# Patient Record
Sex: Female | Born: 1955 | Race: Black or African American | Hispanic: No | Marital: Single | Smoking: Former smoker
Health system: Southern US, Community
[De-identification: ages and names within clinical notes are randomized; demographics above are authoritative.]

## PROBLEM LIST (undated history)

## (undated) DIAGNOSIS — I10 Essential (primary) hypertension: Secondary | ICD-10-CM

## (undated) DIAGNOSIS — D860 Sarcoidosis of lung: Secondary | ICD-10-CM

## (undated) DIAGNOSIS — R0600 Dyspnea, unspecified: Secondary | ICD-10-CM

## (undated) HISTORY — PX: LUNG BIOPSY: SHX232

## (undated) HISTORY — DX: Dyspnea, unspecified: R06.00

---

## 1997-07-19 ENCOUNTER — Other Ambulatory Visit: Admission: RE | Admit: 1997-07-19 | Discharge: 1997-07-19 | Payer: Self-pay | Admitting: Obstetrics

## 1999-02-19 ENCOUNTER — Emergency Department (HOSPITAL_COMMUNITY): Admission: EM | Admit: 1999-02-19 | Discharge: 1999-02-19 | Payer: Self-pay | Admitting: Emergency Medicine

## 1999-05-04 ENCOUNTER — Emergency Department (HOSPITAL_COMMUNITY): Admission: EM | Admit: 1999-05-04 | Discharge: 1999-05-04 | Payer: Self-pay | Admitting: Emergency Medicine

## 1999-11-20 ENCOUNTER — Encounter: Admission: RE | Admit: 1999-11-20 | Discharge: 1999-11-20 | Payer: Self-pay | Admitting: Obstetrics

## 1999-11-20 ENCOUNTER — Encounter: Payer: Self-pay | Admitting: Obstetrics

## 2000-11-20 ENCOUNTER — Encounter: Admission: RE | Admit: 2000-11-20 | Discharge: 2000-11-20 | Payer: Self-pay | Admitting: Obstetrics

## 2000-11-20 ENCOUNTER — Encounter: Payer: Self-pay | Admitting: Obstetrics

## 2001-11-25 ENCOUNTER — Encounter: Admission: RE | Admit: 2001-11-25 | Discharge: 2001-11-25 | Payer: Self-pay | Admitting: Obstetrics

## 2001-11-25 ENCOUNTER — Encounter: Payer: Self-pay | Admitting: Obstetrics

## 2002-11-30 ENCOUNTER — Encounter: Payer: Self-pay | Admitting: Obstetrics

## 2002-11-30 ENCOUNTER — Encounter: Admission: RE | Admit: 2002-11-30 | Discharge: 2002-11-30 | Payer: Self-pay | Admitting: Obstetrics

## 2004-01-05 ENCOUNTER — Encounter: Admission: RE | Admit: 2004-01-05 | Discharge: 2004-01-05 | Payer: Self-pay | Admitting: Obstetrics

## 2005-02-28 ENCOUNTER — Encounter: Admission: RE | Admit: 2005-02-28 | Discharge: 2005-02-28 | Payer: Self-pay | Admitting: Obstetrics

## 2005-03-11 ENCOUNTER — Ambulatory Visit: Payer: Self-pay | Admitting: Internal Medicine

## 2005-04-16 ENCOUNTER — Ambulatory Visit: Payer: Self-pay | Admitting: Internal Medicine

## 2005-05-03 ENCOUNTER — Emergency Department (HOSPITAL_COMMUNITY): Admission: EM | Admit: 2005-05-03 | Discharge: 2005-05-03 | Payer: Self-pay | Admitting: Emergency Medicine

## 2006-08-11 ENCOUNTER — Encounter: Admission: RE | Admit: 2006-08-11 | Discharge: 2006-08-11 | Payer: Self-pay | Admitting: Obstetrics

## 2007-08-12 ENCOUNTER — Encounter: Admission: RE | Admit: 2007-08-12 | Discharge: 2007-08-12 | Payer: Self-pay | Admitting: Obstetrics

## 2008-08-30 ENCOUNTER — Encounter: Admission: RE | Admit: 2008-08-30 | Discharge: 2008-08-30 | Payer: Self-pay | Admitting: Obstetrics

## 2008-09-07 ENCOUNTER — Encounter: Admission: RE | Admit: 2008-09-07 | Discharge: 2008-09-07 | Payer: Self-pay | Admitting: Obstetrics

## 2009-10-19 ENCOUNTER — Encounter: Admission: RE | Admit: 2009-10-19 | Discharge: 2009-10-19 | Payer: Self-pay | Admitting: Obstetrics

## 2010-05-13 ENCOUNTER — Encounter: Payer: Self-pay | Admitting: Obstetrics

## 2010-10-24 ENCOUNTER — Other Ambulatory Visit: Payer: Self-pay | Admitting: Obstetrics

## 2010-10-24 DIAGNOSIS — Z1231 Encounter for screening mammogram for malignant neoplasm of breast: Secondary | ICD-10-CM

## 2010-11-08 ENCOUNTER — Ambulatory Visit: Payer: Self-pay

## 2010-11-08 ENCOUNTER — Ambulatory Visit
Admission: RE | Admit: 2010-11-08 | Discharge: 2010-11-08 | Disposition: A | Discharge status: Home / Self Care | Payer: BC Managed Care – PPO | Source: Ambulatory Visit | Attending: Obstetrics | Admitting: Obstetrics

## 2010-11-08 DIAGNOSIS — Z1231 Encounter for screening mammogram for malignant neoplasm of breast: Secondary | ICD-10-CM

## 2011-11-11 ENCOUNTER — Other Ambulatory Visit: Payer: Self-pay | Admitting: Obstetrics

## 2011-11-11 DIAGNOSIS — Z1231 Encounter for screening mammogram for malignant neoplasm of breast: Secondary | ICD-10-CM

## 2011-11-14 ENCOUNTER — Ambulatory Visit
Admission: RE | Admit: 2011-11-14 | Discharge: 2011-11-14 | Disposition: A | Discharge status: Home / Self Care | Payer: BC Managed Care – PPO | Source: Ambulatory Visit | Attending: Obstetrics | Admitting: Obstetrics

## 2011-11-14 DIAGNOSIS — Z1231 Encounter for screening mammogram for malignant neoplasm of breast: Secondary | ICD-10-CM

## 2012-04-15 ENCOUNTER — Encounter (HOSPITAL_COMMUNITY): Payer: Self-pay | Admitting: Emergency Medicine

## 2012-04-15 ENCOUNTER — Emergency Department (HOSPITAL_COMMUNITY)
Admission: EM | Admit: 2012-04-15 | Discharge: 2012-04-16 | Disposition: A | Discharge status: Home / Self Care | Payer: BC Managed Care – PPO | Attending: Emergency Medicine | Admitting: Emergency Medicine

## 2012-04-15 DIAGNOSIS — D869 Sarcoidosis, unspecified: Secondary | ICD-10-CM | POA: Insufficient documentation

## 2012-04-15 DIAGNOSIS — R109 Unspecified abdominal pain: Secondary | ICD-10-CM

## 2012-04-15 DIAGNOSIS — Z87891 Personal history of nicotine dependence: Secondary | ICD-10-CM | POA: Insufficient documentation

## 2012-04-15 DIAGNOSIS — R1031 Right lower quadrant pain: Secondary | ICD-10-CM | POA: Insufficient documentation

## 2012-04-15 HISTORY — DX: Sarcoidosis of lung: D86.0

## 2012-04-15 LAB — URINALYSIS, ROUTINE W REFLEX MICROSCOPIC
Glucose, UA: NEGATIVE mg/dL
Nitrite: NEGATIVE
Specific Gravity, Urine: 1.029 (ref 1.005–1.030)
pH: 5.5 (ref 5.0–8.0)

## 2012-04-15 LAB — URINE MICROSCOPIC-ADD ON

## 2012-04-15 LAB — CBC
MCH: 26.6 pg (ref 26.0–34.0)
Platelets: 268 10*3/uL (ref 150–400)
RBC: 4.14 MIL/uL (ref 3.87–5.11)
WBC: 6.2 10*3/uL (ref 4.0–10.5)

## 2012-04-15 LAB — POCT I-STAT, CHEM 8
Creatinine, Ser: 1 mg/dL (ref 0.50–1.10)
Glucose, Bld: 96 mg/dL (ref 70–99)
Hemoglobin: 12.2 g/dL (ref 12.0–15.0)
Potassium: 3.9 mEq/L (ref 3.5–5.1)

## 2012-04-15 MED ORDER — ONDANSETRON HCL 4 MG/2ML IJ SOLN
4.0000 mg | Freq: Once | INTRAMUSCULAR | Status: DC
  Filled 2012-04-15: qty 2

## 2012-04-15 MED ORDER — FENTANYL CITRATE 0.05 MG/ML IJ SOLN: 50.0000 ug | INTRAMUSCULAR | Status: DC | PRN

## 2012-04-15 MED ORDER — IOHEXOL 300 MG/ML  SOLN
20.0000 mL | INTRAMUSCULAR | Status: AC
  Administered 2012-04-15 – 2012-04-16 (×2): 20 mL via ORAL

## 2012-04-15 MED ORDER — SODIUM CHLORIDE 0.9 % IV SOLN
INTRAVENOUS | Status: DC
  Administered 2012-04-15: via INTRAVENOUS

## 2012-04-15 NOTE — ED Notes (Addendum)
Pt currently refusing pain medication, st's she's able to tolerate the pain and doesn't like taking pain medication.  Pt st's she will alert staff if pain and/or nausea becomes too much to handle.

## 2012-04-15 NOTE — ED Notes (Signed)
Pt c/o right lower back pain x's 3 days. Denies urinary symptoms.  Also c/o upper left side of lip feeling numb 2 days ago and now has a scab on it.  Pt also c/o left arm and right leg tingling off and on since yesterday.  Pt with multi. complaints

## 2012-04-16 ENCOUNTER — Encounter (HOSPITAL_COMMUNITY): Payer: Self-pay | Admitting: Radiology

## 2012-04-16 ENCOUNTER — Emergency Department (HOSPITAL_COMMUNITY): Payer: BC Managed Care – PPO

## 2012-04-16 LAB — COMPREHENSIVE METABOLIC PANEL
ALT: 13 U/L (ref 0–35)
AST: 17 U/L (ref 0–37)
CO2: 28 mEq/L (ref 19–32)
Calcium: 9.7 mg/dL (ref 8.4–10.5)
Chloride: 102 mEq/L (ref 96–112)
GFR calc non Af Amer: 75 mL/min — ABNORMAL LOW (ref >90)
Potassium: 3.9 mEq/L (ref 3.5–5.1)
Sodium: 138 mEq/L (ref 135–145)

## 2012-04-16 MED ORDER — ACYCLOVIR 400 MG PO TABS: 400.0000 mg | ORAL_TABLET | Freq: Four times a day (QID) | ORAL | Status: DC

## 2012-04-16 MED ORDER — TRAMADOL HCL 50 MG PO TABS: 50.0000 mg | ORAL_TABLET | Freq: Four times a day (QID) | ORAL | Status: DC | PRN

## 2012-04-16 MED ORDER — IOHEXOL 300 MG/ML  SOLN
80.0000 mL | Freq: Once | INTRAMUSCULAR | Status: AC | PRN
  Administered 2012-04-16: 80 mL via INTRAVENOUS

## 2012-04-16 NOTE — ED Provider Notes (Signed)
History     CSN: [id number]  Arrival date & time 04/15/12  1949   First MD Initiated Contact with Patient 04/15/12 2302      Chief Complaint  Patient presents with  . Back Pain    (Consider location/radiation/quality/duration/timing/severity/associated sxs/prior treatment) HPI History provided by patient. Right lower quadrant abdominal pain x3 days worse tonight. No associated nausea vomiting or diarrhea. No hematuria. No known trauma. Has some associated right flank pain and no back pain. Pain is not worse with movement or exertion. Patient has been on vacation and denies any lifting bending or twisting. No fevers or chills. Symptoms moderate in severity. No known aggravating or alleviating factors. No history of same. No dysuria, urgency or frequency. Patient does have cold sore left upper lip that she noticed tonight does not feel this is related to her complaints Past Medical History  Diagnosis Date  . Sarcoidosis of lung     Past Surgical History  Procedure Date  . Lung biopsy     No family history on file.  History  Substance Use Topics  . Smoking status: Former Games developer  . Smokeless tobacco: Not on file  . Alcohol Use: Yes     Comment: wine on occasion    OB History    Grav Para Term Preterm Abortions TAB SAB Ect Mult Living                  Review of Systems  Constitutional: Negative for fever and chills.  HENT: Negative for neck pain and neck stiffness.   Eyes: Negative for pain.  Respiratory: Negative for shortness of breath.   Cardiovascular: Negative for chest pain.  Gastrointestinal: Positive for abdominal pain.  Genitourinary: Negative for dysuria.  Musculoskeletal: Negative for gait problem.  Skin: Negative for rash.  Neurological: Negative for headaches.  All other systems reviewed and are negative.    Allergies  Review of patient's allergies indicates no known allergies.  Home Medications  No current outpatient prescriptions on file.  BP  165/88  Pulse 70  Temp 99.1 F (37.3 C) (Oral)  Resp 18  SpO2 100%  Physical Exam  Nursing note and vitals reviewed. Constitutional: She is oriented to person, place, and time. She appears well-developed and well-nourished.  HENT:  Head: Normocephalic and atraumatic.  Eyes: EOM are normal. Pupils are equal, round, and reactive to light.  Neck: Neck supple.  Cardiovascular: Normal rate, normal heart sounds and intact distal pulses.   Pulmonary/Chest: Effort normal and breath sounds normal. No respiratory distress.  Abdominal:       Soft and nondistended with mild right lower quadrant tenderness. No rebound or guarding or peritonitis  Musculoskeletal: Normal range of motion. She exhibits no edema.  Neurological: She is alert and oriented to person, place, and time.  Skin: Skin is warm and dry.    ED Course  Procedures (including critical care time)  Results for orders placed during the hospital encounter of 04/15/12  URINALYSIS, ROUTINE W REFLEX MICROSCOPIC      Component Value Range   Color, Urine YELLOW  YELLOW   APPearance CLEAR  CLEAR   Specific Gravity, Urine 1.029  1.005 - 1.030   pH 5.5  5.0 - 8.0   Glucose, UA NEGATIVE  NEGATIVE mg/dL   Hgb urine dipstick MODERATE (*) NEGATIVE   Bilirubin Urine NEGATIVE  NEGATIVE   Ketones, ur NEGATIVE  NEGATIVE mg/dL   Protein, ur NEGATIVE  NEGATIVE mg/dL   Urobilinogen, UA 0.2  0.0 -  1.0 mg/dL   Nitrite NEGATIVE  NEGATIVE   Leukocytes, UA SMALL (*) NEGATIVE  URINE MICROSCOPIC-ADD ON      Component Value Range   Squamous Epithelial / LPF FEW (*) RARE   WBC, UA 3-6  <3 WBC/hpf   RBC / HPF 7-10  <3 RBC/hpf   Bacteria, UA FEW (*) RARE   Urine-Other MUCOUS PRESENT    CBC      Component Value Range   WBC 6.2  4.0 - 10.5 K/uL   RBC 4.14  3.87 - 5.11 MIL/uL   Hemoglobin 11.0 (*) 12.0 - 15.0 g/dL   HCT 16.1 (*) 09.6 - 04.5 %   MCV 84.8  78.0 - 100.0 fL   MCH 26.6  26.0 - 34.0 pg   MCHC 31.3  30.0 - 36.0 g/dL   RDW 40.9  81.1 -  91.4 %   Platelets 268  150 - 400 K/uL  COMPREHENSIVE METABOLIC PANEL      Component Value Range   Sodium 138  135 - 145 mEq/L   Potassium 3.9  3.5 - 5.1 mEq/L   Chloride 102  96 - 112 mEq/L   CO2 28  19 - 32 mEq/L   Glucose, Bld 97  70 - 99 mg/dL   BUN 14  6 - 23 mg/dL   Creatinine, Ser 7.82  0.50 - 1.10 mg/dL   Calcium 9.7  8.4 - 95.6 mg/dL   Total Protein 7.5  6.0 - 8.3 g/dL   Albumin 3.6  3.5 - 5.2 g/dL   AST 17  0 - 37 U/L   ALT 13  0 - 35 U/L   Alkaline Phosphatase 65  39 - 117 U/L   Total Bilirubin 0.2 (*) 0.3 - 1.2 mg/dL   GFR calc non Af Amer 75 (*) >90 mL/min   GFR calc Af Amer 87 (*) >90 mL/min  LIPASE, BLOOD      Component Value Range   Lipase 29  11 - 59 U/L  POCT I-STAT, CHEM 8      Component Value Range   Sodium 141  135 - 145 mEq/L   Potassium 3.9  3.5 - 5.1 mEq/L   Chloride 106  96 - 112 mEq/L   BUN 14  6 - 23 mg/dL   Creatinine, Ser 2.13  0.50 - 1.10 mg/dL   Glucose, Bld 96  70 - 99 mg/dL   Calcium, Ion 0.86  5.78 - 1.23 mmol/L   TCO2 28  0 - 100 mmol/L   Hemoglobin 12.2  12.0 - 15.0 g/dL   HCT 46.9  62.9 - 52.8 %   Ct Abdomen Pelvis W Contrast  04/16/2012  *RADIOLOGY REPORT*  Clinical Data: Right lower quadrant pain and right flank pain. Right-sided numbness.  CT ABDOMEN AND PELVIS WITH CONTRAST  Technique:  Multidetector CT imaging of the abdomen and pelvis was performed following the standard protocol during bolus administration of intravenous contrast.  Contrast: 80mL OMNIPAQUE IOHEXOL 300 MG/ML  SOLN  Comparison: None.  Findings: Minimal bibasilar atelectasis is noted.  The liver and spleen are unremarkable in appearance.  The gallbladder is within normal limits.  The pancreas and adrenal glands are unremarkable.  The kidneys are unremarkable in appearance.  There is no evidence of hydronephrosis.  No renal or ureteral stones are seen.  No perinephric stranding is appreciated.  No free fluid is identified.  The small bowel is unremarkable in appearance.   The stomach is within normal limits.  No acute  vascular abnormalities are seen.  The appendix is normal in caliber and contains contrast, without evidence for appendicitis.  Contrast progresses to the hepatic flexure of the colon.  The sigmoid colon is mildly redundant; the colon is unremarkable in appearance.  The bladder is mildly distended and grossly unremarkable in appearance.  The uterus is grossly unremarkable.  A small amount of air is noted within the vagina, outlining the cervix.  The ovaries are relatively symmetric; no suspicious adnexal masses are seen. No inguinal lymphadenopathy is seen.  No acute osseous abnormalities are identified.  IMPRESSION: Unremarkable contrast CT of the abdomen and pelvis.   Original Report Authenticated By: Tonia Ghent, M.D.     IV fluids. N.p.o. IV fentanyl and Zofran provided.  Recheck pain improved and CT scan results shared with patient. Repeat exam improved tenderness with no peritonitis MDM  Right lower abdominal pain with workup as above: CT scan, labs and UA reviewed. Urine culture pending. No UTI symptoms. Symptoms improved with IV narcotics. Plan discharge home with outpatient followup with pain medications as needed. Vital signs and nursing notes reviewed.        Sunnie Nielsen, MD 04/16/12 [phone]

## 2012-04-16 NOTE — ED Notes (Addendum)
Pt finished with PO CT contrast, CT notified.

## 2012-04-17 LAB — URINE CULTURE

## 2012-04-18 NOTE — ED Notes (Signed)
+   Urine Chart sent to EDP office for review. 

## 2012-04-21 NOTE — ED Notes (Signed)
Chart returned from EDP office .

## 2012-04-21 NOTE — ED Notes (Signed)
Rx for Macrobid 100 mg sig: one tablet bid x 5 days #10 per Pascal Lux Wingen PA-C

## 2012-10-13 ENCOUNTER — Other Ambulatory Visit: Payer: Self-pay

## 2012-10-13 DIAGNOSIS — Z1231 Encounter for screening mammogram for malignant neoplasm of breast: Secondary | ICD-10-CM

## 2012-11-15 ENCOUNTER — Ambulatory Visit
Admission: RE | Admit: 2012-11-15 | Discharge: 2012-11-15 | Disposition: A | Discharge status: Home / Self Care | Payer: BC Managed Care – PPO | Source: Ambulatory Visit

## 2012-11-15 DIAGNOSIS — Z1231 Encounter for screening mammogram for malignant neoplasm of breast: Secondary | ICD-10-CM

## 2012-12-24 ENCOUNTER — Other Ambulatory Visit: Payer: Self-pay

## 2013-10-10 ENCOUNTER — Other Ambulatory Visit: Payer: Self-pay

## 2013-10-10 DIAGNOSIS — Z1231 Encounter for screening mammogram for malignant neoplasm of breast: Secondary | ICD-10-CM

## 2013-11-18 ENCOUNTER — Ambulatory Visit
Admission: RE | Admit: 2013-11-18 | Discharge: 2013-11-18 | Disposition: A | Discharge status: Home / Self Care | Payer: BC Managed Care – PPO | Source: Ambulatory Visit

## 2013-11-18 ENCOUNTER — Encounter (INDEPENDENT_AMBULATORY_CARE_PROVIDER_SITE_OTHER): Payer: Self-pay

## 2013-11-18 DIAGNOSIS — Z1231 Encounter for screening mammogram for malignant neoplasm of breast: Secondary | ICD-10-CM

## 2014-11-20 ENCOUNTER — Other Ambulatory Visit: Payer: Self-pay

## 2014-11-20 DIAGNOSIS — Z1231 Encounter for screening mammogram for malignant neoplasm of breast: Secondary | ICD-10-CM

## 2014-11-24 ENCOUNTER — Ambulatory Visit
Admission: RE | Admit: 2014-11-24 | Discharge: 2014-11-24 | Disposition: A | Discharge status: Home / Self Care | Payer: BC Managed Care – PPO | Source: Ambulatory Visit

## 2014-11-24 DIAGNOSIS — Z1231 Encounter for screening mammogram for malignant neoplasm of breast: Secondary | ICD-10-CM

## 2014-12-04 ENCOUNTER — Telehealth: Payer: Self-pay | Admitting: Family Medicine

## 2014-12-04 NOTE — Telephone Encounter (Signed)
Ok as long as pt understands I have limited schedule

## 2014-12-04 NOTE — Telephone Encounter (Signed)
Pt would like to est with dr Artist Pais as new pt. Pt mother Randa Spike is a pt.

## 2014-12-05 NOTE — Telephone Encounter (Signed)
Pt has been sch

## 2014-12-06 ENCOUNTER — Encounter: Payer: Self-pay | Admitting: Family

## 2014-12-06 ENCOUNTER — Ambulatory Visit (INDEPENDENT_AMBULATORY_CARE_PROVIDER_SITE_OTHER): Payer: BC Managed Care – PPO | Admitting: Family

## 2014-12-06 ENCOUNTER — Ambulatory Visit: Payer: BC Managed Care – PPO | Admitting: Internal Medicine

## 2014-12-06 DIAGNOSIS — I10 Essential (primary) hypertension: Secondary | ICD-10-CM | POA: Diagnosis not present

## 2014-12-06 DIAGNOSIS — I152 Hypertension secondary to endocrine disorders: Secondary | ICD-10-CM | POA: Insufficient documentation

## 2014-12-06 MED ORDER — AMLODIPINE BESYLATE 5 MG PO TABS: 5.0000 mg | ORAL_TABLET | Freq: Every day | ORAL | Status: DC

## 2014-12-06 NOTE — Progress Notes (Signed)
   Subjective:    Patient ID: Ariel Carpenter, female    DOB: 12-09-1955, 59 y.o.   MRN: [id number]  HPI  59 year old African-American female, nonsmoker with a history of hypertension is in today to be established. She is originally to establish with Dr. Artist Pais.  She was started on amlodipine 5 mg by gynecology that she tolerates well. Denies any swelling. Has been on lisinopril in the past. Blood pressure stable   Review of Systems  Constitutional: Negative.   Respiratory: Negative.   Cardiovascular: Negative.  Negative for leg swelling.  Gastrointestinal: Negative.   Endocrine: Negative.   Genitourinary: Negative.   Musculoskeletal: Negative.   Skin: Negative.   Neurological: Negative.   Hematological: Negative.   Psychiatric/Behavioral: Negative.   . Past Medical History  Diagnosis Date  . Sarcoidosis of lung     Social History   Social History  . Marital Status: Single    Spouse Name: N/A  . Number of Children: N/A  . Years of Education: N/A   Occupational History  . Not on file.   Social History Main Topics  . Smoking status: Former Games developer  . Smokeless tobacco: Not on file  . Alcohol Use: Yes     Comment: wine on occasion  . Drug Use: Not on file  . Sexual Activity: Not on file   Other Topics Concern  . Not on file   Social History Narrative    Past Surgical History  Procedure Laterality Date  . Lung biopsy      History reviewed. No pertinent family history.  No Known Allergies  Current Outpatient Prescriptions on File Prior to Visit  Medication Sig Dispense Refill  . acyclovir (ZOVIRAX) 400 MG tablet Take 1 tablet (400 mg total) by mouth 4 (four) times daily. 50 tablet 0  . traMADol (ULTRAM) 50 MG tablet Take 1 tablet (50 mg total) by mouth every 6 (six) hours as needed for pain. 15 tablet 0   No current facility-administered medications on file prior to visit.    There were no vitals taken for this visit.chart    Objective:   Physical Exam    Constitutional: She is oriented to person, place, and time. She appears well-developed and well-nourished.  Neck: Normal range of motion. Neck supple.  Cardiovascular: Normal rate, regular rhythm and normal heart sounds.   Pulmonary/Chest: Effort normal and breath sounds normal.  Abdominal: Soft. Bowel sounds are normal.  Musculoskeletal: Normal range of motion.  Neurological: She is alert and oriented to person, place, and time.  Skin: Skin is warm and dry.  Psychiatric: She has a normal mood and affect.          Assessment & Plan:  Diagnoses and all orders for this visit:  Essential hypertension  Other orders -     amLODipine (NORVASC) 5 MG tablet; Take 1 tablet (5 mg total) by mouth daily.    continue amlodipine. Call the office with any questions or concerns. Follow-up with Dr. Artist Pais is scheduled and sooner as needed.

## 2014-12-07 MED ORDER — AMLODIPINE BESYLATE 5 MG PO TABS: 5.0000 mg | ORAL_TABLET | Freq: Every day | ORAL | Status: DC

## 2014-12-07 NOTE — Addendum Note (Signed)
Addended by: Azucena Freed on: 12/07/2014 12:52 PM   Modules accepted: Orders

## 2014-12-22 ENCOUNTER — Ambulatory Visit: Payer: BC Managed Care – PPO | Admitting: Internal Medicine

## 2014-12-28 ENCOUNTER — Ambulatory Visit: Payer: BC Managed Care – PPO

## 2015-02-21 ENCOUNTER — Ambulatory Visit: Payer: BC Managed Care – PPO | Admitting: Internal Medicine

## 2015-07-01 ENCOUNTER — Other Ambulatory Visit: Payer: Self-pay | Admitting: Family

## 2015-12-03 ENCOUNTER — Other Ambulatory Visit: Payer: Self-pay | Admitting: Internal Medicine

## 2015-12-03 DIAGNOSIS — Z1231 Encounter for screening mammogram for malignant neoplasm of breast: Secondary | ICD-10-CM

## 2015-12-07 ENCOUNTER — Ambulatory Visit
Admission: RE | Admit: 2015-12-07 | Discharge: 2015-12-07 | Disposition: A | Discharge status: Home / Self Care | Payer: BC Managed Care – PPO | Source: Ambulatory Visit | Attending: Internal Medicine | Admitting: Internal Medicine

## 2015-12-07 DIAGNOSIS — Z1231 Encounter for screening mammogram for malignant neoplasm of breast: Secondary | ICD-10-CM

## 2016-04-17 ENCOUNTER — Ambulatory Visit (INDEPENDENT_AMBULATORY_CARE_PROVIDER_SITE_OTHER): Payer: Self-pay | Admitting: Family

## 2016-04-17 VITALS — BP 140/90 | HR 86 | Temp 98.3°F | Ht 63.25 in | Wt 170.0 lb

## 2016-04-17 DIAGNOSIS — Z0000 Encounter for general adult medical examination without abnormal findings: Secondary | ICD-10-CM

## 2016-04-17 DIAGNOSIS — Z111 Encounter for screening for respiratory tuberculosis: Secondary | ICD-10-CM

## 2016-04-17 NOTE — Progress Notes (Signed)
Subjective:     Patient ID: Ariel Carpenter, female   DOB: 09/21/1955, 60 y.o.   MRN: [id number]  HPI  60 year old female, nonsmoker, this a routine wellness  examination for this patient. She has a history of Hypertension. Takes Amlodipine and tolerates it well. I reviewed all health maintenance protocols including mammography, colonoscopy, bone density Needed referrals were placed. Age and diagnosis  appropriate screening labs were ordered. Her immunization history was reviewed and appropriate vaccinations were ordered. Her current medications and allergies were reviewed and needed refills of her chronic medications were ordered. The plan for yearly health maintenance was discussed all orders and referrals were made as appropriate.  Review of Systems  All other systems reviewed and are negative.  Past Medical History:  Diagnosis Date  . Sarcoidosis of lung [facility](HCC)     Social History   Social History  . Marital status: Single    Spouse name: N/A  . Number of children: N/A  . Years of education: N/A   Occupational History  . Not on file.   Social History Main Topics  . Smoking status: Former Games developermoker  . Smokeless tobacco: Not on file  . Alcohol use Yes     Comment: wine on occasion  . Drug use: Unknown  . Sexual activity: Not on file   Other Topics Concern  . Not on file   Social History Narrative  . No narrative on file    Past Surgical History:  Procedure Laterality Date  . LUNG BIOPSY      No family history on file.  Allergies  Allergen Reactions  . Penicillins     Current Outpatient Prescriptions on File Prior to Visit  Medication Sig Dispense Refill  . amLODipine (NORVASC) 5 MG tablet Take 1 tablet (5 mg total) by mouth daily. 30 tablet 5  . acyclovir (ZOVIRAX) 400 MG tablet Take 1 tablet (400 mg total) by mouth 4 (four) times daily. 50 tablet 0  . traMADol (ULTRAM) 50 MG tablet Take 1 tablet (50 mg total) by mouth every 6 (six) hours as needed for pain. 15  tablet 0   No current facility-administered medications on file prior to visit.     BP 140/90   Pulse 86   Temp 98.3 F (36.8 C) (Oral)   Ht 5' 3.25" (1.607 m)   Wt 170 lb (77.1 kg)   BMI 29.88 kg/m chart    Objective:   Physical Exam  Constitutional: She is oriented to person, place, and time. She appears well-developed and well-nourished.  HENT:  Head: Normocephalic and atraumatic.  Right Ear: External ear normal.  Left Ear: External ear normal.  Nose: Nose normal.  Mouth/Throat: Oropharynx is clear and moist.  Eyes: Conjunctivae and EOM are normal. Pupils are equal, round, and reactive to light.  Neck: Normal range of motion. Neck supple. No thyromegaly present.  Cardiovascular: Normal rate, regular rhythm and normal heart sounds.   Pulmonary/Chest: Effort normal and breath sounds normal.  Abdominal: Soft. Bowel sounds are normal.  Musculoskeletal: Normal range of motion.  Neurological: She is alert and oriented to person, place, and time. She has normal reflexes.  Skin: Skin is warm and dry.  Psychiatric: She has a normal mood and affect.       Assessment:     1. Routine Physical Exam  2. Hypertension    Plan:     Encouraged a healthy diet, exercise, self breast exams, annual mammogram, and routine colonoscopy screening. PPD applied RTC  in 2-3 days to have it read.

## 2016-04-17 NOTE — Patient Instructions (Signed)
Exercising to Stay Healthy Introduction Exercising regularly is important. It has many health benefits, such as:  Improving your overall fitness, flexibility, and endurance.  Increasing your bone density.  Helping with weight control.  Decreasing your body fat.  Increasing your muscle strength.  Reducing stress and tension.  Improving your overall health. In order to become healthy and stay healthy, it is recommended that you do moderate-intensity and vigorous-intensity exercise. You can tell that you are exercising at a moderate intensity if you have a higher heart rate and faster breathing, but you are still able to hold a conversation. You can tell that you are exercising at a vigorous intensity if you are breathing much harder and faster and cannot hold a conversation while exercising. How often should I exercise? Choose an activity that you enjoy and set realistic goals. Your health care provider can help you to make an activity plan that works for you. Exercise regularly as directed by your health care provider. This may include:  Doing resistance training twice each week, such as:  Push-ups.  Sit-ups.  Lifting weights.  Using resistance bands.  Doing a given intensity of exercise for a given amount of time. Choose from these options:  150 minutes of moderate-intensity exercise every week.  75 minutes of vigorous-intensity exercise every week.  A mix of moderate-intensity and vigorous-intensity exercise every week. Children, pregnant women, people who are out of shape, people who are overweight, and older adults may need to consult a health care provider for individual recommendations. If you have any sort of medical condition, be sure to consult your health care provider before starting a new exercise program. What are some exercise ideas? Some moderate-intensity exercise ideas include:  Walking at a rate of 1 mile in 15  minutes.  Biking.  Hiking.  Golfing.  Dancing. Some vigorous-intensity exercise ideas include:  Walking at a rate of at least 4.5 miles per hour.  Jogging or running at a rate of 5 miles per hour.  Biking at a rate of at least 10 miles per hour.  Lap swimming.  Roller-skating or in-line skating.  Cross-country skiing.  Vigorous competitive sports, such as football, basketball, and soccer.  Jumping rope.  Aerobic dancing. What are some everyday activities that can help me to get exercise?  Yard work, such as:  Pushing a lawn mower.  Raking and bagging leaves.  Washing and waxing your car.  Pushing a stroller.  Shoveling snow.  Gardening.  Washing windows or floors. How can I be more active in my day-to-day activities?  Use the stairs instead of the elevator.  Take a walk during your lunch break.  If you drive, park your car farther away from work or school.  If you take public transportation, get off one stop early and walk the rest of the way.  Make all of your phone calls while standing up and walking around.  Get up, stretch, and walk around every 30 minutes throughout the day. What guidelines should I follow while exercising?  Do not exercise so much that you hurt yourself, feel dizzy, or get very short of breath.  Consult your health care provider before starting a new exercise program.  Wear comfortable clothes and shoes with good support.  Drink plenty of water while you exercise to prevent dehydration or heat stroke. Body water is lost during exercise and must be replaced.  Work out until you breathe faster and your heart beats faster. This information is not intended to replace   advice given to you by your health care provider. Make sure you discuss any questions you have with your health care provider. Document Released: 05/10/2010 Document Revised: 09/13/2015 Document Reviewed: 09/08/2013  2017 Elsevier  

## 2016-04-20 LAB — TB SKIN TEST
Induration: 0 mm
TB Skin Test: NEGATIVE

## 2016-07-29 ENCOUNTER — Ambulatory Visit (HOSPITAL_COMMUNITY)
Admission: EM | Admit: 2016-07-29 | Discharge: 2016-07-29 | Disposition: A | Discharge status: Home / Self Care | Payer: BC Managed Care – PPO | Attending: Family Medicine | Admitting: Family Medicine

## 2016-07-29 ENCOUNTER — Encounter (HOSPITAL_COMMUNITY): Payer: Self-pay | Admitting: *Deleted

## 2016-07-29 DIAGNOSIS — R51 Headache: Secondary | ICD-10-CM

## 2016-07-29 DIAGNOSIS — S161XXA Strain of muscle, fascia and tendon at neck level, initial encounter: Secondary | ICD-10-CM | POA: Diagnosis not present

## 2016-07-29 DIAGNOSIS — R519 Headache, unspecified: Secondary | ICD-10-CM

## 2016-07-29 HISTORY — DX: Essential (primary) hypertension: I10

## 2016-07-29 MED ORDER — METHOCARBAMOL 500 MG PO TABS: 500.0000 mg | ORAL_TABLET | Freq: Two times a day (BID) | ORAL | 0 refills | Status: DC
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
## 2016-11-24 ENCOUNTER — Other Ambulatory Visit: Payer: Self-pay | Admitting: Internal Medicine

## 2016-11-24 DIAGNOSIS — Z1231 Encounter for screening mammogram for malignant neoplasm of breast: Secondary | ICD-10-CM

## 2016-12-08 ENCOUNTER — Ambulatory Visit: Payer: BC Managed Care – PPO

## 2017-01-19 ENCOUNTER — Ambulatory Visit: Payer: BC Managed Care – PPO

## 2017-01-19 ENCOUNTER — Ambulatory Visit
Admission: RE | Admit: 2017-01-19 | Discharge: 2017-01-19 | Disposition: A | Discharge status: Home / Self Care | Payer: BC Managed Care – PPO | Source: Ambulatory Visit | Attending: Internal Medicine | Admitting: Internal Medicine

## 2017-01-19 DIAGNOSIS — Z1231 Encounter for screening mammogram for malignant neoplasm of breast: Secondary | ICD-10-CM

## 2017-01-22 ENCOUNTER — Ambulatory Visit: Payer: BC Managed Care – PPO

## 2017-12-15 ENCOUNTER — Other Ambulatory Visit: Payer: Self-pay | Admitting: Internal Medicine

## 2017-12-15 DIAGNOSIS — Z1231 Encounter for screening mammogram for malignant neoplasm of breast: Secondary | ICD-10-CM

## 2018-01-20 ENCOUNTER — Ambulatory Visit
Admission: RE | Admit: 2018-01-20 | Discharge: 2018-01-20 | Disposition: A | Discharge status: Home / Self Care | Payer: BC Managed Care – PPO | Source: Ambulatory Visit | Attending: Internal Medicine | Admitting: Internal Medicine

## 2018-01-20 DIAGNOSIS — Z1231 Encounter for screening mammogram for malignant neoplasm of breast: Secondary | ICD-10-CM

## 2018-01-21 ENCOUNTER — Ambulatory Visit: Payer: BC Managed Care – PPO

## 2019-02-28 ENCOUNTER — Other Ambulatory Visit: Payer: Self-pay

## 2019-02-28 ENCOUNTER — Ambulatory Visit
Admission: RE | Admit: 2019-02-28 | Discharge: 2019-02-28 | Disposition: A | Discharge status: Home / Self Care | Payer: BC Managed Care – PPO | Source: Ambulatory Visit | Attending: Internal Medicine | Admitting: Internal Medicine

## 2019-02-28 ENCOUNTER — Other Ambulatory Visit: Payer: Self-pay | Admitting: Internal Medicine

## 2019-02-28 DIAGNOSIS — Z1231 Encounter for screening mammogram for malignant neoplasm of breast: Secondary | ICD-10-CM

## 2019-10-06 ENCOUNTER — Encounter (INDEPENDENT_AMBULATORY_CARE_PROVIDER_SITE_OTHER): Payer: Self-pay | Admitting: Family Medicine

## 2019-10-06 ENCOUNTER — Other Ambulatory Visit: Payer: Self-pay

## 2019-10-06 ENCOUNTER — Ambulatory Visit (INDEPENDENT_AMBULATORY_CARE_PROVIDER_SITE_OTHER): Payer: BC Managed Care – PPO | Admitting: Family Medicine

## 2019-10-06 VITALS — BP 166/85 | HR 64 | Temp 97.9°F | Ht 63.0 in | Wt 172.0 lb

## 2019-10-06 DIAGNOSIS — G4709 Other insomnia: Secondary | ICD-10-CM

## 2019-10-06 DIAGNOSIS — R0602 Shortness of breath: Secondary | ICD-10-CM | POA: Diagnosis not present

## 2019-10-06 DIAGNOSIS — J45909 Unspecified asthma, uncomplicated: Secondary | ICD-10-CM | POA: Diagnosis not present

## 2019-10-06 DIAGNOSIS — Z9189 Other specified personal risk factors, not elsewhere classified: Secondary | ICD-10-CM | POA: Diagnosis not present

## 2019-10-06 DIAGNOSIS — Z6830 Body mass index (BMI) 30.0-30.9, adult: Secondary | ICD-10-CM

## 2019-10-06 DIAGNOSIS — I10 Essential (primary) hypertension: Secondary | ICD-10-CM | POA: Diagnosis not present

## 2019-10-06 DIAGNOSIS — Z0289 Encounter for other administrative examinations: Secondary | ICD-10-CM

## 2019-10-06 DIAGNOSIS — R5383 Other fatigue: Secondary | ICD-10-CM

## 2019-10-06 DIAGNOSIS — Z1331 Encounter for screening for depression: Secondary | ICD-10-CM | POA: Diagnosis not present

## 2019-10-06 DIAGNOSIS — E669 Obesity, unspecified: Secondary | ICD-10-CM

## 2019-10-07 LAB — CBC WITH DIFFERENTIAL/PLATELET
Basophils Absolute: 0.1 10*3/uL (ref 0.0–0.2)
Basos: 2 %
EOS (ABSOLUTE): 0.1 10*3/uL (ref 0.0–0.4)
Eos: 2 %
Hematocrit: 38.6 % (ref 34.0–46.6)
Hemoglobin: 11.9 g/dL (ref 11.1–15.9)
Immature Grans (Abs): 0 10*3/uL (ref 0.0–0.1)
Immature Granulocytes: 0 %
Lymphocytes Absolute: 2.4 10*3/uL (ref 0.7–3.1)
Lymphs: 46 %
MCH: 26.2 pg — ABNORMAL LOW (ref 26.6–33.0)
MCHC: 30.8 g/dL — ABNORMAL LOW (ref 31.5–35.7)
MCV: 85 fL (ref 79–97)
Monocytes Absolute: 0.4 10*3/uL (ref 0.1–0.9)
Monocytes: 7 %
Neutrophils Absolute: 2.3 10*3/uL (ref 1.4–7.0)
Neutrophils: 43 %
Platelets: 308 10*3/uL (ref 150–450)
RBC: 4.55 x10E6/uL (ref 3.77–5.28)
RDW: 14.5 % (ref 11.7–15.4)
WBC: 5.2 10*3/uL (ref 3.4–10.8)

## 2019-10-07 LAB — TSH: TSH: 0.471 u[IU]/mL (ref 0.450–4.500)

## 2019-10-07 LAB — LIPID PANEL WITH LDL/HDL RATIO
Cholesterol, Total: 272 mg/dL — ABNORMAL HIGH (ref 100–199)
HDL: 54 mg/dL (ref >39)
LDL Chol Calc (NIH): 194 mg/dL — ABNORMAL HIGH (ref 0–99)
LDL/HDL Ratio: 3.6 ratio — ABNORMAL HIGH (ref 0.0–3.2)
Triglycerides: 133 mg/dL (ref 0–149)
VLDL Cholesterol Cal: 24 mg/dL (ref 5–40)

## 2019-10-07 LAB — COMPREHENSIVE METABOLIC PANEL
ALT: 19 IU/L (ref 0–32)
AST: 20 IU/L (ref 0–40)
Albumin/Globulin Ratio: 1.3 (ref 1.2–2.2)
Albumin: 4 g/dL (ref 3.8–4.8)
Alkaline Phosphatase: 77 IU/L (ref 48–121)
BUN/Creatinine Ratio: 12 (ref 12–28)
BUN: 11 mg/dL (ref 8–27)
Bilirubin Total: 0.2 mg/dL (ref 0.0–1.2)
CO2: 25 mmol/L (ref 20–29)
Calcium: 9.7 mg/dL (ref 8.7–10.3)
Chloride: 103 mmol/L (ref 96–106)
Creatinine, Ser: 0.95 mg/dL (ref 0.57–1.00)
GFR calc Af Amer: 74 mL/min/{1.73_m2} (ref >59)
GFR calc non Af Amer: 64 mL/min/{1.73_m2} (ref >59)
Globulin, Total: 3.2 g/dL (ref 1.5–4.5)
Glucose: 91 mg/dL (ref 65–99)
Potassium: 4.3 mmol/L (ref 3.5–5.2)
Sodium: 142 mmol/L (ref 134–144)
Total Protein: 7.2 g/dL (ref 6.0–8.5)

## 2019-10-07 LAB — INSULIN, RANDOM: INSULIN: 13.7 u[IU]/mL (ref 2.6–24.9)

## 2019-10-07 LAB — VITAMIN B12: Vitamin B-12: 517 pg/mL (ref 232–1245)

## 2019-10-07 LAB — HEMOGLOBIN A1C
Est. average glucose Bld gHb Est-mCnc: 140 mg/dL
Hgb A1c MFr Bld: 6.5 % — ABNORMAL HIGH (ref 4.8–5.6)

## 2019-10-07 LAB — T3: T3, Total: 104 ng/dL (ref 71–180)

## 2019-10-07 LAB — T4, FREE: Free T4: 1.16 ng/dL (ref 0.82–1.77)

## 2019-10-07 LAB — FOLATE: Folate: 8.5 ng/mL (ref >3.0)

## 2019-10-07 LAB — VITAMIN D 25 HYDROXY (VIT D DEFICIENCY, FRACTURES): Vit D, 25-Hydroxy: 10.8 ng/mL — ABNORMAL LOW (ref 30.0–100.0)

## 2019-10-12 NOTE — Progress Notes (Signed)
Chief Complaint:   OBESITY ECKO Ariel Carpenter (MR# [id number]) is a 64 y.o. female who presents for evaluation and treatment of obesity and related comorbidities. Current BMI is Body mass index is 30.47 kg/m. Ariel Carpenter has been struggling with her weight for many years and has been unsuccessful in either losing weight, maintaining weight loss, or reaching her healthy weight goal.  Ariel Carpenter is currently in the action stage of change and ready to dedicate time achieving and maintaining a healthier weight. Ariel Carpenter is interested in becoming our patient and working on intensive lifestyle modifications including (but not limited to) diet and exercise for weight loss.  Ariel Carpenter's habits were reviewed today and are as follows: Her family eats meals together, she thinks her family will eat healthier with her, her desired weight loss is 17 lbs, she started gaining weight at 64 years old, her heaviest weight ever was 188 pounds, she is a picky eater and doesn't like to eat healthier foods, she has significant food cravings issues, she skips meals frequently, she is frequently drinking liquids with calories, she frequently makes poor food choices, she frequently eats larger portions than normal and she struggles with emotional eating.  Depression Screen Ariel Carpenter's Food and Mood (modified PHQ-9) score was 3.  Depression screen PHQ 2/9 10/06/2019  Decreased Interest 1  Down, Depressed, Hopeless 1  PHQ - 2 Score 2  Altered sleeping 0  Tired, decreased energy 0  Change in appetite 0  Feeling bad or failure about yourself  0  Trouble concentrating 1  Moving slowly or fidgety/restless 0  Suicidal thoughts 0  PHQ-9 Score 3  Difficult doing work/chores Not difficult at all   Subjective:   1. Other fatigue [name] admits to daytime somnolence and denies waking up still tired. Patent has a history of symptoms of daytime fatigue. Ariel Carpenter generally gets 5 hours of sleep per night, and states that she has generally  restful sleep. Snoring is present. Apneic episodes are not present. Epworth Sleepiness Score is 4.  2. Shortness of breath on exertion Ariel Carpenter notes increasing shortness of breath with exercising and seems to be worsening over time with weight gain. She notes getting out of breath sooner with activity than she used to. This has not gotten worse recently. Ariel Carpenter denies shortness of breath at rest or orthopnea.  3. Essential hypertension Ariel Carpenter is not taking her blood pressure medications the past 3-4 days. She notes dyspnea with exertion, and she denies chest pain, tightness, or headache. She denies palpitations or dizziness. Her medications are managed by her primary care physician.  4. Reactive airway disease without complication, unspecified asthma severity, unspecified whether persistent Ariel Carpenter notes dyspnea with exertion. She has a rescue inhaler but not on maintenance medications, and she has note been on them in recent past. She states she currently does not need her rescue inhaler.  5. Other insomnia Ariel Carpenter notes difficulty to fall asleep. She usually gets 5 hours of sleep. She fall asleep at 1 AM or so, and she is up at 6 AM usually. She notes worries about various things and her mind is difficult to turn off.  6. At risk for medication nonadherence Ariel Carpenter is at a higher than average risk for medication non-adherence due to not taking her anti-hypertensive medications, causing her blood pressure to be high today. This also puts her at higher risk of renal diagnosis and cardiac complications.  Assessment/Plan:   1. Other fatigue Ariel Carpenter does feel that her weight is causing her energy  to be lower than it should be. Fatigue may be related to obesity, depression or many other causes. Labs will be ordered, and in the meanwhile, [name] will focus on self care including making healthy food choices, increasing physical activity and focusing on stress reduction.  - EKG 12-Lead - CBC with  Differential/Platelet - Comprehensive metabolic panel - Hemoglobin A1c - Insulin, random - Lipid Panel With LDL/HDL Ratio - VITAMIN D 25 Hydroxy (Vit-D Deficiency, Fractures) - Vitamin B12 - Folate - T3 - T4, free - TSH  2. Shortness of breath on exertion Ariel Carpenter does feel that she gets out of breath more easily that she used to when she exercises. Ariel Carpenter's shortness of breath appears to be obesity related and exercise induced. She has agreed to work on weight loss and gradually increase exercise to treat her exercise induced shortness of breath. Will continue to monitor closely.  - Vitamin B12 - Folate - T3 - T4, free - TSH  3. Essential hypertension Ariel Carpenter is working on healthy weight loss and exercise to improve blood pressure control. We will watch for signs of hypotension as she continues her lifestyle modifications. We will check labs today. Home blood pressure monitoring was encouraged today. She is to adhere to taking her medications daily as directed.  - CBC with Differential/Platelet - Comprehensive metabolic panel - Hemoglobin A1c - Insulin, random - Lipid Panel With LDL/HDL Ratio - VITAMIN D 25 Hydroxy (Vit-D Deficiency, Fractures) - Vitamin B12 - Folate - T3 - T4, free - TSH  4. Reactive airway disease without complication, unspecified asthma severity, unspecified whether persistent We will check labs, and will continue to monitor closely. Ariel Carpenter will follow up as directed.  5. Other insomnia The problem of recurrent insomnia was discussed. Orders and follow up as documented in patient record. Counseling: Intensive lifestyle modifications are the first line treatment for this issue. We discussed several lifestyle modifications today. Ariel Carpenter will continue to work on diet, exercise and weight loss efforts. We will continue to monitor closely, and will consider Dr. Dewaine Conger referral in the future.  - Vitamin B12 - Folate - T3 - T4, free - TSH  6. Depression  screening Ariel Carpenter had a negative depression screening. Depression is commonly associated with obesity and often results in emotional eating behaviors. We will monitor this closely and work on CBT to help improve the non-hunger eating patterns. Referral to Psychology may be required if no improvement is seen as she continues in our clinic.  7. At risk for medication nonadherence Kassadee was given approximately 30 minutes of counseling today to help her avoid medication non-adherence.  We discussed importance of taking medications at a similar time each day and the use of daily pill organizers to help improve medication adherence.  Repetitive spaced learning was employed today to elicit superior memory formation and behavioral change.  8. Class 1 obesity with serious comorbidity and body mass index (BMI) of 30.0 to 30.9 in adult, unspecified obesity type Maddeline is currently in the action stage of change and her goal is to continue with weight loss efforts. I recommend Roneisha begin the structured treatment plan as follows:  She has agreed to the Category 1 Plan + 100 calores.  Exercise goals: As is.   Behavioral modification strategies: no skipping meals and planning for success.  She was informed of the importance of frequent follow-up visits to maximize her success with intensive lifestyle modifications for her multiple health conditions. She was informed we would discuss her lab results at  her next visit unless there is a critical issue that needs to be addressed sooner. Kathyann agreed to keep her next visit at the agreed upon time to discuss these results.  Objective:   Blood pressure (!) 166/85, pulse 64, temperature 97.9 F (36.6 C), temperature source Oral, height 5\' 3"  (1.6 m), weight 172 lb (78 kg), SpO2 98 %. Body mass index is 30.47 kg/m.  EKG: Normal sinus rhythm, rate 67 BPM.  Indirect Calorimeter completed today shows a VO2 of 166 and a REE of 1157.  Her calculated basal metabolic  rate is 0712 thus her basal metabolic rate is worse than expected.  General: Cooperative, alert, well developed, in no acute distress. HEENT: Conjunctivae and lids unremarkable. Cardiovascular: Regular rhythm.  Lungs: Normal work of breathing. Neurologic: No focal deficits.   Lab Results  Component Value Date   CREATININE 0.95 10/06/2019   BUN 11 10/06/2019   NA 142 10/06/2019   K 4.3 10/06/2019   CL 103 10/06/2019   CO2 25 10/06/2019   Lab Results  Component Value Date   ALT 19 10/06/2019   AST 20 10/06/2019   ALKPHOS 77 10/06/2019   BILITOT 0.2 10/06/2019   Lab Results  Component Value Date   HGBA1C 6.5 (H) 10/06/2019   Lab Results  Component Value Date   INSULIN 13.7 10/06/2019   Lab Results  Component Value Date   TSH 0.471 10/06/2019   Lab Results  Component Value Date   CHOL 272 (H) 10/06/2019   HDL 54 10/06/2019   LDLCALC 194 (H) 10/06/2019   TRIG 133 10/06/2019   Lab Results  Component Value Date   WBC 5.2 10/06/2019   HGB 11.9 10/06/2019   HCT 38.6 10/06/2019   MCV 85 10/06/2019   PLT 308 10/06/2019   No results found for: IRON, TIBC, FERRITIN  Attestation Statements:   Reviewed by clinician on day of visit: allergies, medications, problem list, medical history, surgical history, family history, social history, and previous encounter notes.    I, Trixie Dredge, am acting as transcriptionist for Dennard Nip, MD.  I have reviewed the above documentation for accuracy and completeness, and I agree with the above. - Dennard Nip, MD

## 2019-10-13 ENCOUNTER — Ambulatory Visit (INDEPENDENT_AMBULATORY_CARE_PROVIDER_SITE_OTHER): Payer: Self-pay | Admitting: Family Medicine

## 2019-10-20 ENCOUNTER — Ambulatory Visit (INDEPENDENT_AMBULATORY_CARE_PROVIDER_SITE_OTHER): Payer: BC Managed Care – PPO | Admitting: Family Medicine

## 2019-10-20 ENCOUNTER — Encounter (INDEPENDENT_AMBULATORY_CARE_PROVIDER_SITE_OTHER): Payer: Self-pay | Admitting: Family Medicine

## 2019-10-20 ENCOUNTER — Other Ambulatory Visit: Payer: Self-pay

## 2019-10-20 VITALS — BP 155/82 | HR 77 | Temp 98.4°F | Ht 63.0 in | Wt 172.0 lb

## 2019-10-20 DIAGNOSIS — Z9189 Other specified personal risk factors, not elsewhere classified: Secondary | ICD-10-CM

## 2019-10-20 DIAGNOSIS — E559 Vitamin D deficiency, unspecified: Secondary | ICD-10-CM | POA: Diagnosis not present

## 2019-10-20 DIAGNOSIS — E785 Hyperlipidemia, unspecified: Secondary | ICD-10-CM

## 2019-10-20 DIAGNOSIS — E669 Obesity, unspecified: Secondary | ICD-10-CM

## 2019-10-20 DIAGNOSIS — E1159 Type 2 diabetes mellitus with other circulatory complications: Secondary | ICD-10-CM

## 2019-10-20 DIAGNOSIS — E1169 Type 2 diabetes mellitus with other specified complication: Secondary | ICD-10-CM | POA: Diagnosis not present

## 2019-10-20 DIAGNOSIS — I10 Essential (primary) hypertension: Secondary | ICD-10-CM

## 2019-10-20 DIAGNOSIS — Z6830 Body mass index (BMI) 30.0-30.9, adult: Secondary | ICD-10-CM

## 2019-10-20 MED ORDER — METFORMIN HCL 500 MG PO TABS: 500.0000 mg | ORAL_TABLET | Freq: Every day | ORAL | 0 refills | Status: DC

## 2019-10-20 MED ORDER — VITAMIN D (ERGOCALCIFEROL) 1.25 MG (50000 UNIT) PO CAPS: 50000.0000 [IU] | ORAL_CAPSULE | ORAL | 0 refills | Status: DC

## 2019-10-25 NOTE — Progress Notes (Signed)
Chief Complaint:   OBESITY Ariel Carpenter is here to discuss her progress with her obesity treatment plan along with follow-up of her obesity related diagnoses. Ariel Carpenter is on the Category 1 Plan + 100 calories and states she is following her eating plan approximately 92% of the time. Ariel Carpenter states she is walking for 60 minutes 5 times per week.  Today's visit was #: 2 Starting weight: 172 lbs Starting date: 10/06/2019 Today's weight: 172 lbs Today's date: 10/20/2019 Total lbs lost to date: 0 Total lbs lost since last in-office visit: 0  Interim History: Ariel Carpenter has done well on her Category 1 plan. She notes she got hungry between meals however, but she did well with meal planning overall.  Subjective:   1. Type 2 diabetes mellitus with other specified complication, without long-term current use of insulin (HCC) Ariel Carpenter has a new diagnosis of diabetes mellitus II. She has an A1c of 6.5 and she denies a history of diabetes mellitus II previously. She notes polyphagia, and she would like to control with diet as much as possible. I discussed labs with the patient today.  2. Hyperlipidemia associated with type 2 diabetes mellitus (HCC) Ariel Carpenter's LDL is very elevated. She has a new diagnosis of diabetes mellitus II, but she is resistant to starting medications. She is doing well with her diet prescription. I discussed labs with the patient today.  3. Hypertension associated with diabetes (HCC) Ariel Carpenter's blood pressure is elevated today. She has been out of her medications for 2 days, and she is picking up her prescription today. I discussed labs with the patient today.  4. Vitamin D deficiency Ariel Carpenter has a new diagnosis of Vit D deficiency. Her Vit D level is very low and she notes fatigue. She is not on multivitamins or Vit D. I discussed labs with the patient today.  5. At risk for deficient knowledge of diabetes mellitus Ariel Carpenter is at risk for deficient knowledge of diabetes  mellitus.  Assessment/Plan:   1. Type 2 diabetes mellitus with other specified complication, without long-term current use of insulin (HCC) Good blood sugar control is important to decrease the likelihood of diabetic complications such as nephropathy, neuropathy, limb loss, blindness, coronary artery disease, and death. Intensive lifestyle modification including diet, exercise and weight loss are the first line of treatment for diabetes. Ariel Carpenter agreed to start metformin 500 mg q AM with no refills, and she will continue her Category 1 eating plan.  - metFORMIN (GLUCOPHAGE) 500 MG tablet; Take 1 tablet (500 mg total) by mouth daily with breakfast.  Dispense: 30 tablet; Refill: 0  2. Hyperlipidemia associated with type 2 diabetes mellitus (HCC) Cardiovascular risk and specific lipid/LDL goals reviewed. We discussed several lifestyle modifications today. Ariel Carpenter will continue to work on diet, exercise and weight loss efforts. We will recheck labs in 3 months. Orders and follow up as documented in patient record.   3. Hypertension associated with diabetes (HCC) Ariel Carpenter will continue her medications and diet, and will continue working on healthy weight loss and exercise to improve blood pressure control. We will watch for signs of hypotension as she continues her lifestyle modifications. We will recheck her blood pressure in 2 weeks.  4. Vitamin D deficiency Low Vitamin D level contributes to fatigue and are associated with obesity, breast, and colon cancer. Ariel Carpenter agreed to start prescription Vitamin D 50,000 IU every week with no refills. She will follow-up for routine testing of Vitamin D, at least 2-3 times per year to avoid over-replacement.  We will recheck labs in 3 months.  - Vitamin D, Ergocalciferol, (DRISDOL) 1.25 MG (50000 UNIT) CAPS capsule; Take 1 capsule (50,000 Units total) by mouth every 7 (seven) days.  Dispense: 4 capsule; Refill: 0  5. At risk for deficient knowledge of diabetes  mellitus Ariel Carpenter was given approximately 30 minutes of diabetes education and counseling today. We discussed intensive lifestyle modifications today with an emphasis on weight loss as well as increasing exercise and decreasing simple carbohydrates in her diet. We also reviewed medication options with an emphasis on risk versus benefit of those discussed.   6. Class 1 obesity with serious comorbidity and body mass index (BMI) of 30.0 to 30.9 in adult, unspecified obesity type Ariel Carpenter is currently in the action stage of change. As such, her goal is to continue with weight loss efforts. She has agreed to the Category 1 Plan + 100 calories.   Exercise goals: As is.  Behavioral modification strategies: increasing lean protein intake.  Ariel Carpenter has agreed to follow-up with our clinic in 2 weeks. She was informed of the importance of frequent follow-up visits to maximize her success with intensive lifestyle modifications for her multiple health conditions.   Objective:   Blood pressure (!) 155/82, pulse 77, temperature 98.4 F (36.9 C), temperature source Oral, height 5\' 3"  (1.6 m), weight 172 lb (78 kg), SpO2 98 %. Body mass index is 30.47 kg/m.  General: Cooperative, alert, well developed, in no acute distress. HEENT: Conjunctivae and lids unremarkable. Cardiovascular: Regular rhythm.  Lungs: Normal work of breathing. Neurologic: No focal deficits.   Lab Results  Component Value Date   CREATININE 0.95 10/06/2019   BUN 11 10/06/2019   NA 142 10/06/2019   K 4.3 10/06/2019   CL 103 10/06/2019   CO2 25 10/06/2019   Lab Results  Component Value Date   ALT 19 10/06/2019   AST 20 10/06/2019   ALKPHOS 77 10/06/2019   BILITOT 0.2 10/06/2019   Lab Results  Component Value Date   HGBA1C 6.5 (H) 10/06/2019   Lab Results  Component Value Date   INSULIN 13.7 10/06/2019   Lab Results  Component Value Date   TSH 0.471 10/06/2019   Lab Results  Component Value Date   CHOL 272 (H)  10/06/2019   HDL 54 10/06/2019   LDLCALC 194 (H) 10/06/2019   TRIG 133 10/06/2019   Lab Results  Component Value Date   WBC 5.2 10/06/2019   HGB 11.9 10/06/2019   HCT 38.6 10/06/2019   MCV 85 10/06/2019   PLT 308 10/06/2019   No results found for: IRON, TIBC, FERRITIN  Attestation Statements:   Reviewed by clinician on day of visit: allergies, medications, problem list, medical history, surgical history, family history, social history, and previous encounter notes.   I, 10/08/2019, am acting as transcriptionist for Burt Knack, MD.  I have reviewed the above documentation for accuracy and completeness, and I agree with the above. -  Quillian Quince, MD

## 2019-10-27 ENCOUNTER — Ambulatory Visit (INDEPENDENT_AMBULATORY_CARE_PROVIDER_SITE_OTHER): Payer: Self-pay | Admitting: Family Medicine

## 2019-11-07 ENCOUNTER — Ambulatory Visit (INDEPENDENT_AMBULATORY_CARE_PROVIDER_SITE_OTHER): Payer: BC Managed Care – PPO | Admitting: Adult Health

## 2019-11-08 ENCOUNTER — Encounter (INDEPENDENT_AMBULATORY_CARE_PROVIDER_SITE_OTHER): Payer: Self-pay | Admitting: Adult Health

## 2019-11-08 ENCOUNTER — Other Ambulatory Visit: Payer: Self-pay

## 2019-11-08 ENCOUNTER — Ambulatory Visit (INDEPENDENT_AMBULATORY_CARE_PROVIDER_SITE_OTHER): Payer: BC Managed Care – PPO | Admitting: Adult Health

## 2019-11-08 VITALS — BP 148/79 | HR 78 | Temp 98.1°F | Ht 63.0 in | Wt 168.0 lb

## 2019-11-08 DIAGNOSIS — E669 Obesity, unspecified: Secondary | ICD-10-CM

## 2019-11-08 DIAGNOSIS — E118 Type 2 diabetes mellitus with unspecified complications: Secondary | ICD-10-CM

## 2019-11-08 DIAGNOSIS — E785 Hyperlipidemia, unspecified: Secondary | ICD-10-CM

## 2019-11-08 DIAGNOSIS — Z9189 Other specified personal risk factors, not elsewhere classified: Secondary | ICD-10-CM | POA: Diagnosis not present

## 2019-11-08 DIAGNOSIS — E1169 Type 2 diabetes mellitus with other specified complication: Secondary | ICD-10-CM | POA: Diagnosis not present

## 2019-11-08 DIAGNOSIS — E559 Vitamin D deficiency, unspecified: Secondary | ICD-10-CM | POA: Diagnosis not present

## 2019-11-08 DIAGNOSIS — Z6830 Body mass index (BMI) 30.0-30.9, adult: Secondary | ICD-10-CM

## 2019-11-08 MED ORDER — VITAMIN D (ERGOCALCIFEROL) 1.25 MG (50000 UNIT) PO CAPS: 50000.0000 [IU] | ORAL_CAPSULE | ORAL | 0 refills | Status: DC

## 2019-11-09 DIAGNOSIS — E559 Vitamin D deficiency, unspecified: Secondary | ICD-10-CM | POA: Insufficient documentation

## 2019-11-09 DIAGNOSIS — E785 Hyperlipidemia, unspecified: Secondary | ICD-10-CM | POA: Insufficient documentation

## 2019-11-09 DIAGNOSIS — E119 Type 2 diabetes mellitus without complications: Secondary | ICD-10-CM | POA: Insufficient documentation

## 2019-11-09 DIAGNOSIS — Z6830 Body mass index (BMI) 30.0-30.9, adult: Secondary | ICD-10-CM | POA: Insufficient documentation

## 2019-11-09 DIAGNOSIS — E118 Type 2 diabetes mellitus with unspecified complications: Secondary | ICD-10-CM | POA: Insufficient documentation

## 2019-11-09 NOTE — Progress Notes (Signed)
Chief Complaint:   OBESITY Ariel Carpenter is here to discuss her progress with her obesity treatment plan along with follow-up of her obesity related diagnoses. Yumi is on the Category 1 Plan and states she is following her eating plan approximately 80% of the time. Sister states she is exercising 0 minutes 0 times per week.  Today's visit was #: 3 Starting weight: 172 lbs Starting date: 10/06/2019 Today's weight: 168 lbs Today's date: 11/08/2019 Total lbs lost to date: 4 Total lbs lost since last in-office visit: 4  Interim History: Ariel Carpenter has enjoyed the Category 1 meal plan and denies excessive cravings or polyphagia when eating on plan. She will be traveling to [facility] with a friend for a long weekend and is interested in strategies to stay on track.  Subjective:   Vitamin D deficiency. Chala is on Ergocalciferol and denies nausea, vomiting, or muscle weakness.    Ref. Range 10/06/2019 12:23  Vitamin D, 25-Hydroxy Latest Ref Range: 30.0 - 100.0 ng/mL 10.8 (L)   Type 2 diabetes with complication (HCC). Ariel Carpenter is on metformin 500 mg at breakfast.   Lab Results  Component Value Date   HGBA1C 6.5 (H) 10/06/2019   Lab Results  Component Value Date   LDLCALC 194 (H) 10/06/2019   CREATININE 0.95 10/06/2019   Lab Results  Component Value Date   INSULIN 13.7 10/06/2019   Hyperlipidemia associated with type 2 diabetes mellitus (HCC). Ariel Carpenter is not on statin therapy and prefers lifestyle changes to normalize lipid levels. She denies tobacco/vape use.   Lab Results  Component Value Date   CHOL 272 (H) 10/06/2019   HDL 54 10/06/2019   LDLCALC 194 (H) 10/06/2019   TRIG 133 10/06/2019   Lab Results  Component Value Date   ALT 19 10/06/2019   AST 20 10/06/2019   ALKPHOS 77 10/06/2019   BILITOT 0.2 10/06/2019   The 10-year ASCVD risk score [name], et al., 2013) is: 33.1%   Values used to calculate the score:     Age: 48 years     Sex: Female     Is  Non-Hispanic African American: Yes     Diabetic: Yes     Tobacco smoker: No     Systolic Blood Pressure: 148 mmHg     Is BP treated: Yes     HDL Cholesterol: 54 mg/dL     Total Cholesterol: 272 mg/dL  At risk for heart disease. Ariel Carpenter is at a higher than average risk for cardiovascular disease due to hyperlipidemia, type II diabetes mellitus, and obesity.   Assessment/Plan:   Vitamin D deficiency. Low Vitamin D level contributes to fatigue and are associated with obesity, breast, and colon cancer. She was given a refill on her Vitamin D, Ergocalciferol, (DRISDOL) 1.25 MG (50000 UNIT) CAPS capsule every week #4 with 0 refills and will follow-up for routine testing of Vitamin D, at least 2-3 times per year to avoid over-replacement.   Type 2 diabetes with complication (HCC). Good blood sugar control is important to decrease the likelihood of diabetic complications such as nephropathy, neuropathy, limb loss, blindness, coronary artery disease, and death. Intensive lifestyle modification including diet, exercise and weight loss are the first line of treatment for diabetes. Maribelle will continue metformin and the Category 1 meal plan.  Hyperlipidemia associated with type 2 diabetes mellitus (HCC). Cardiovascular risk and specific lipid/LDL goals reviewed.  We discussed several lifestyle modifications today and Krissi will continue to work on diet, exercise  and weight loss efforts. Orders and follow up as documented in patient record. Kennede will continue to follow the Category 1 meal plan. Will monitor labs closely.  Counseling Intensive lifestyle modifications are the first line treatment for this issue. . Dietary changes: Increase soluble fiber. Decrease simple carbohydrates. . Exercise changes: Moderate to vigorous-intensity aerobic activity 150 minutes per week if tolerated. . Lipid-lowering medications: see documented in medical record.  At risk for heart disease. Ariel Carpenter was given  approximately 15 minutes of coronary artery disease prevention counseling today. She is 64 y.o. female and has risk factors for heart disease including obesity. We discussed intensive lifestyle modifications today with an emphasis on specific weight loss instructions and strategies.   Repetitive spaced learning was employed today to elicit superior memory formation and behavioral change.  Class 1 obesity with serious comorbidity and body mass index (BMI) of 30.0 to 30.9 in adult, unspecified obesity type - BMI was greater than 30 at the start of the program.  Ariel Carpenter is currently in the action stage of change. As such, her goal is to continue with weight loss efforts. She has agreed to the Category 1 Plan.   Handouts were provided on Additional Breakfast Options and High Protein/Low Calorie Snack Options.  Exercise goals: No exercise has been prescribed at this time.  Behavioral modification strategies: increasing lean protein intake, increasing water intake, no skipping meals, travel eating strategies and planning for success.  Ariel Carpenter has agreed to follow-up with our clinic in 2 weeks. She was informed of the importance of frequent follow-up visits to maximize her success with intensive lifestyle modifications for her multiple health conditions.   Objective:   Blood pressure (!) 148/79, pulse 78, temperature 98.1 F (36.7 C), temperature source Oral, height 5\' 3"  (1.6 m), weight 168 lb (76.2 kg), SpO2 98 %. Body mass index is 29.76 kg/m.  General: Cooperative, alert, well developed, in no acute distress. HEENT: Conjunctivae and lids unremarkable. Cardiovascular: Regular rhythm.  Lungs: Normal work of breathing. Neurologic: No focal deficits.   Lab Results  Component Value Date   CREATININE 0.95 10/06/2019   BUN 11 10/06/2019   NA 142 10/06/2019   K 4.3 10/06/2019   CL 103 10/06/2019   CO2 25 10/06/2019   Lab Results  Component Value Date   ALT 19 10/06/2019   AST 20  10/06/2019   ALKPHOS 77 10/06/2019   BILITOT 0.2 10/06/2019   Lab Results  Component Value Date   HGBA1C 6.5 (H) 10/06/2019   Lab Results  Component Value Date   INSULIN 13.7 10/06/2019   Lab Results  Component Value Date   TSH 0.471 10/06/2019   Lab Results  Component Value Date   CHOL 272 (H) 10/06/2019   HDL 54 10/06/2019   LDLCALC 194 (H) 10/06/2019   TRIG 133 10/06/2019   Lab Results  Component Value Date   WBC 5.2 10/06/2019   HGB 11.9 10/06/2019   HCT 38.6 10/06/2019   MCV 85 10/06/2019   PLT 308 10/06/2019   No results found for: IRON, TIBC, FERRITIN  Attestation Statements:   Reviewed by clinician on day of visit: allergies, medications, problem list, medical history, surgical history, family history, social history, and previous encounter notes.  I, 10/08/2019, am acting as [employer] for Energy manager, NP-C   I have reviewed the above documentation for accuracy and completeness, and I agree with the above. -  [employer], NP

## 2019-11-15 ENCOUNTER — Other Ambulatory Visit (INDEPENDENT_AMBULATORY_CARE_PROVIDER_SITE_OTHER): Payer: Self-pay | Admitting: Family Medicine

## 2019-11-15 DIAGNOSIS — E1169 Type 2 diabetes mellitus with other specified complication: Secondary | ICD-10-CM

## 2019-11-30 ENCOUNTER — Ambulatory Visit (INDEPENDENT_AMBULATORY_CARE_PROVIDER_SITE_OTHER): Payer: BC Managed Care – PPO | Admitting: Adult Health

## 2019-11-30 ENCOUNTER — Other Ambulatory Visit: Payer: Self-pay

## 2019-11-30 ENCOUNTER — Encounter (INDEPENDENT_AMBULATORY_CARE_PROVIDER_SITE_OTHER): Payer: Self-pay | Admitting: Adult Health

## 2019-11-30 VITALS — BP 126/76 | HR 72 | Temp 98.1°F | Ht 63.0 in | Wt 167.0 lb

## 2019-11-30 DIAGNOSIS — I152 Hypertension secondary to endocrine disorders: Secondary | ICD-10-CM

## 2019-11-30 DIAGNOSIS — E118 Type 2 diabetes mellitus with unspecified complications: Secondary | ICD-10-CM

## 2019-11-30 DIAGNOSIS — E1159 Type 2 diabetes mellitus with other circulatory complications: Secondary | ICD-10-CM | POA: Diagnosis not present

## 2019-11-30 DIAGNOSIS — E559 Vitamin D deficiency, unspecified: Secondary | ICD-10-CM

## 2019-11-30 DIAGNOSIS — Z6830 Body mass index (BMI) 30.0-30.9, adult: Secondary | ICD-10-CM

## 2019-11-30 DIAGNOSIS — E669 Obesity, unspecified: Secondary | ICD-10-CM

## 2019-11-30 DIAGNOSIS — I10 Essential (primary) hypertension: Secondary | ICD-10-CM

## 2019-11-30 DIAGNOSIS — E1169 Type 2 diabetes mellitus with other specified complication: Secondary | ICD-10-CM

## 2019-11-30 DIAGNOSIS — E785 Hyperlipidemia, unspecified: Secondary | ICD-10-CM

## 2019-11-30 DIAGNOSIS — Z9189 Other specified personal risk factors, not elsewhere classified: Secondary | ICD-10-CM | POA: Diagnosis not present

## 2019-11-30 MED ORDER — VITAMIN D (ERGOCALCIFEROL) 1.25 MG (50000 UNIT) PO CAPS: 50000.0000 [IU] | ORAL_CAPSULE | ORAL | 0 refills | Status: DC

## 2019-11-30 MED ORDER — METFORMIN HCL 500 MG PO TABS: 500.0000 mg | ORAL_TABLET | Freq: Every day | ORAL | 0 refills | Status: DC

## 2019-11-30 NOTE — Progress Notes (Signed)
Chief Complaint:   OBESITY Ariel Carpenter is here to discuss her progress with her obesity treatment plan along with follow-up of her obesity related diagnoses. Ariel Carpenter is on the Category 1 Plan and states she is following her eating plan approximately 50% of the time. Ariel Carpenter states she is exercising 0 minutes 0 times per week.  Today's visit was #: 4 Starting weight: 172 lbs Starting date: 10/06/2019 Today's weight: 167 lbs Today's date: 11/30/2019 Total lbs lost to date: 5 Total lbs lost since last in-office visit: 1  Interim History: Ariel Carpenter recently traveled to [facility] with friends for a week and she had a great time! She focused on portion control/smart choices, increased water intake, and walked frequently while on vacation. She continues to enjoy the food and structure of the Category 1 meal plan.  Subjective:   Type 2 diabetes with complication without long term insulin use(HCC). Ariel Carpenter is on metformin 500 mg daily and denies GI upset.  Lab Results  Component Value Date   HGBA1C 6.5 (H) 10/06/2019   Lab Results  Component Value Date   LDLCALC 194 (H) 10/06/2019   CREATININE 0.95 10/06/2019   Lab Results  Component Value Date   INSULIN 13.7 10/06/2019   Hypertension associated with type 2 diabetes mellitus (HCC). Blood pressure is at goal at today's office visit. Ariel Carpenter is on lisinopril 20 mg daily, but has not taken this in a week as she left her prescription out-of-town.  BP Readings from Last 3 Encounters:  11/30/19 126/76  11/08/19 (!) 148/79  10/20/19 (!) 155/82   Lab Results  Component Value Date   CREATININE 0.95 10/06/2019   CREATININE 1.00 04/15/2012   CREATININE 0.85 04/15/2012   Vitamin D deficiency. Ariel Carpenter is on Ergocalciferol. No nausea, vomiting, or muscle weakness.    Ref. Range 10/06/2019 12:23  Vitamin D, 25-Hydroxy Latest Ref Range: 30.0 - 100.0 ng/mL 10.8 (L)   Hyperlipidemia associated with type 2 diabetes mellitus (HCC). Ariel Carpenter is  on statin therapy. She has previously declined statin therapy- prefers lifestyle modification to normalize lipids.   Lab Results  Component Value Date   CHOL 272 (H) 10/06/2019   HDL 54 10/06/2019   LDLCALC 194 (H) 10/06/2019   TRIG 133 10/06/2019   Lab Results  Component Value Date   ALT 19 10/06/2019   AST 20 10/06/2019   ALKPHOS 77 10/06/2019   BILITOT 0.2 10/06/2019   The 10-year ASCVD risk score [name], et al., 2013) is: 23.1%   Values used to calculate the score:     Age: 77 years     Sex: Female     Is Non-Hispanic African American: Yes     Diabetic: Yes     Tobacco smoker: No     Systolic Blood Pressure: 126 mmHg     Is BP treated: Yes     HDL Cholesterol: 54 mg/dL     Total Cholesterol: 272 mg/dL  At risk for heart disease. Ariel Carpenter is at a higher than average risk for cardiovascular disease due to hypertension, type II diabetes mellitus, and obesity.   Assessment/Plan:   Type 2 diabetes with complication without long term insulin use(HCC). Good blood sugar control is important to decrease the likelihood of diabetic complications such as nephropathy, neuropathy, limb loss, blindness, coronary artery disease, and death. Intensive lifestyle modification including diet, exercise and weight loss are the first line of treatment for diabetes. Refill was given for metFORMIN (GLUCOPHAGE) 500 MG tablet daily #  30 with 0 refills. Labs will be checked in September 2021.  Hypertension associated with type 2 diabetes mellitus (HCC). [name] is working on healthy weight loss and exercise to improve blood pressure control. We will watch for signs of hypotension as she continues her lifestyle modifications. Ariel Carpenter will continue to follow the Category 1 meal plan and will continue ACE as directed. Will monitor blood pressure closely.  Vitamin D deficiency. Low Vitamin D level contributes to fatigue and are associated with obesity, breast, and colon cancer. She was given a refill on her  Vitamin D, Ergocalciferol, (DRISDOL) 1.25 MG (50000 UNIT) CAPS capsule every week #4 with 0 refills and will follow-up for routine testing of Vitamin D in September 2021.   Hyperlipidemia associated with type 2 diabetes mellitus (HCC). Cardiovascular risk and specific lipid/LDL goals reviewed.  We discussed several lifestyle modifications today and Ariel Carpenter will continue to work on diet, exercise and weight loss efforts. Orders and follow up as documented in patient record. Ariel Carpenter was instructed to continue reducing her saturated fat intake. Labs will be checked in September 2021.  Counseling Intensive lifestyle modifications are the first line treatment for this issue. . Dietary changes: Increase soluble fiber. Decrease simple carbohydrates. . Exercise changes: Moderate to vigorous-intensity aerobic activity 150 minutes per week if tolerated. . Lipid-lowering medications: see documented in medical record.  At risk for heart disease. Ariel Carpenter was given approximately 15 minutes of coronary artery disease prevention counseling today. She is 64 y.o. female and has risk factors for heart disease including obesity. We discussed intensive lifestyle modifications today with an emphasis on specific weight loss instructions and strategies.   Repetitive spaced learning was employed today to elicit superior memory formation and behavioral change.  Class 1 obesity with serious comorbidity and body mass index (BMI) of 30.0 to 30.9 in adult, unspecified obesity type - BMI greater than 30 at the start of program.  Ariel Carpenter is currently in the action stage of change. As such, her goal is to continue with weight loss efforts. She has agreed to the Category 1 Plan.   Exercise goals: No exercise has been prescribed at this time.  Behavioral modification strategies: increasing lean protein intake, increasing water intake, meal planning and cooking strategies and planning for success.  Ariel Carpenter has agreed to follow-up with  our clinic in 2 weeks. She was informed of the importance of frequent follow-up visits to maximize her success with intensive lifestyle modifications for her multiple health conditions.   Objective:   Blood pressure 126/76, pulse 72, temperature 98.1 F (36.7 C), temperature source Oral, height 5\' 3"  (1.6 m), weight 167 lb (75.8 kg), SpO2 98 %. Body mass index is 29.58 kg/m.  General: Cooperative, alert, well developed, in no acute distress. HEENT: Conjunctivae and lids unremarkable. Cardiovascular: Regular rhythm.  Lungs: Normal work of breathing. Neurologic: No focal deficits.   Lab Results  Component Value Date   CREATININE 0.95 10/06/2019   BUN 11 10/06/2019   NA 142 10/06/2019   K 4.3 10/06/2019   CL 103 10/06/2019   CO2 25 10/06/2019   Lab Results  Component Value Date   ALT 19 10/06/2019   AST 20 10/06/2019   ALKPHOS 77 10/06/2019   BILITOT 0.2 10/06/2019   Lab Results  Component Value Date   HGBA1C 6.5 (H) 10/06/2019   Lab Results  Component Value Date   INSULIN 13.7 10/06/2019   Lab Results  Component Value Date   TSH 0.471 10/06/2019   Lab Results  Component Value Date   CHOL 272 (H) 10/06/2019   HDL 54 10/06/2019   LDLCALC 194 (H) 10/06/2019   TRIG 133 10/06/2019   Lab Results  Component Value Date   WBC 5.2 10/06/2019   HGB 11.9 10/06/2019   HCT 38.6 10/06/2019   MCV 85 10/06/2019   PLT 308 10/06/2019   No results found for: IRON, TIBC, FERRITIN  Attestation Statements:   Reviewed by clinician on day of visit: allergies, medications, problem list, medical history, surgical history, family history, social history, and previous encounter notes.  I, Marianna Payment, am acting as Energy manager for [employer], NP-C   I have reviewed the above documentation for accuracy and completeness, and I agree with the above. -  Julaine Fusi, NP

## 2019-12-19 ENCOUNTER — Other Ambulatory Visit: Payer: Self-pay | Admitting: Internal Medicine

## 2019-12-19 DIAGNOSIS — Z1231 Encounter for screening mammogram for malignant neoplasm of breast: Secondary | ICD-10-CM

## 2019-12-21 ENCOUNTER — Ambulatory Visit (INDEPENDENT_AMBULATORY_CARE_PROVIDER_SITE_OTHER): Payer: BC Managed Care – PPO | Admitting: Adult Health

## 2019-12-21 ENCOUNTER — Encounter (INDEPENDENT_AMBULATORY_CARE_PROVIDER_SITE_OTHER): Payer: Self-pay | Admitting: Adult Health

## 2019-12-21 ENCOUNTER — Other Ambulatory Visit: Payer: Self-pay

## 2019-12-21 VITALS — BP 129/82 | HR 74 | Temp 97.9°F | Ht 63.0 in | Wt 165.0 lb

## 2019-12-21 DIAGNOSIS — E559 Vitamin D deficiency, unspecified: Secondary | ICD-10-CM

## 2019-12-21 DIAGNOSIS — E118 Type 2 diabetes mellitus with unspecified complications: Secondary | ICD-10-CM

## 2019-12-21 DIAGNOSIS — E1169 Type 2 diabetes mellitus with other specified complication: Secondary | ICD-10-CM | POA: Diagnosis not present

## 2019-12-21 DIAGNOSIS — E669 Obesity, unspecified: Secondary | ICD-10-CM

## 2019-12-21 DIAGNOSIS — Z6830 Body mass index (BMI) 30.0-30.9, adult: Secondary | ICD-10-CM

## 2019-12-21 DIAGNOSIS — E785 Hyperlipidemia, unspecified: Secondary | ICD-10-CM

## 2019-12-21 NOTE — Progress Notes (Signed)
Chief Complaint:   OBESITY Ariel Carpenter is here to discuss her progress with her obesity treatment plan along with follow-up of her obesity related diagnoses. Ariel Carpenter is on the Category 1 Plan + 100 snack calories and states she is following her eating plan approximately 85% of the time. Ariel Carpenter states she is exercising 0 minutes 0 times per week.  Today's visit was #: 5 Starting weight: 172 lbs Starting date: 10/06/2019 Today's weight: 165 lbs Today's date: 12/21/2019 Total lbs lost to date: 7 Total lbs lost since last in-office visit: 2  Interim History: Ariel Carpenter's ill aunt passed away at the age of 34. She attended a family funeral last week and the event was outdoors. She continues to enjoy the foods and structure of the Category 1 meal plan. On September 18 she is hosting a reception and she would like to lose a few more lbs to wear her blue/gold jumpsuit.  Subjective:   Vitamin D deficiency. Ariel Carpenter is on Ergocalciferol. No nausea, vomiting, or muscle weakness.    Ref. Range 10/06/2019 12:23  Vitamin D, 25-Hydroxy Latest Ref Range: 30.0 - 100.0 ng/mL 10.8 (L)   Type 2 diabetes with complication without long term insulin use(HCC). Ariel Carpenter is on metformin 500 mg daily at breakfast. She denies GI upset or polyphagia.  Lab Results  Component Value Date   HGBA1C 6.5 (H) 10/06/2019   Lab Results  Component Value Date   LDLCALC 194 (H) 10/06/2019   CREATININE 0.95 10/06/2019   Lab Results  Component Value Date   INSULIN 13.7 10/06/2019   Mixed hyperlipidemia associated with type 2 diabetes mellitus (HCC). Ariel Carpenter is not on statin therapy.   Lab Results  Component Value Date   CHOL 272 (H) 10/06/2019   HDL 54 10/06/2019   LDLCALC 194 (H) 10/06/2019   TRIG 133 10/06/2019   Lab Results  Component Value Date   ALT 19 10/06/2019   AST 20 10/06/2019   ALKPHOS 77 10/06/2019   BILITOT 0.2 10/06/2019   The 10-year ASCVD risk score [name], et al., 2013) is: 24.4%    Values used to calculate the score:     Age: 64 years     Sex: Female     Is Non-Hispanic African American: Yes     Diabetic: Yes     Tobacco smoker: No     Systolic Blood Pressure: 129 mmHg     Is BP treated: Yes     HDL Cholesterol: 54 mg/dL     Total Cholesterol: 272 mg/dL  Assessment/Plan:   Vitamin D deficiency. Low Vitamin D level contributes to fatigue and are associated with obesity, breast, and colon cancer. She agrees to continue to take Ergocalciferol as directed and will follow-up for routine testing of Vitamin D at her next office visit.  Type 2 diabetes with complication without long term insulin use(HCC). Good blood sugar control is important to decrease the likelihood of diabetic complications such as nephropathy, neuropathy, limb loss, blindness, coronary artery disease, and death. Intensive lifestyle modification including diet, exercise and weight loss are the first line of treatment for diabetes. Ariel Carpenter will continue the Category 1 meal plan. Labs will be checked at her next office visit.  Mixed hyperlipidemia associated with type 2 diabetes mellitus (HCC). Cardiovascular risk and specific lipid/LDL goals reviewed.  We discussed several lifestyle modifications today and Ariel Carpenter will continue to work on diet, exercise and weight loss efforts. Orders and follow up as documented in patient record. Ariel Carpenter  will limit her saturated fat intake. Labs will be checked at her next office visit.  Counseling Intensive lifestyle modifications are the first line treatment for this issue.  Dietary changes: Increase soluble fiber. Decrease simple carbohydrates.  Exercise changes: Moderate to vigorous-intensity aerobic activity 150 minutes per week if tolerated.  Lipid-lowering medications: see documented in medical record.  Class 1 obesity with serious comorbidity and body mass index (BMI) of 30.0 to 30.9 in adult, unspecified obesity type.  Ariel Carpenter is currently in the action stage of  change. As such, her goal is to continue with weight loss efforts. She has agreed to the Category 1 Plan + 100 snack calories.   Exercise goals: No exercise has been prescribed at this time.  Behavioral modification strategies: increasing lean protein intake, decreasing simple carbohydrates, increasing water intake, meal planning and cooking strategies and planning for success.  Ariel Carpenter has agreed to follow-up with our clinic fasting in 2 weeks. She was informed of the importance of frequent follow-up visits to maximize her success with intensive lifestyle modifications for her multiple health conditions.   Objective:   Blood pressure 129/82, pulse 74, temperature 97.9 F (36.6 C), temperature source Oral, height 5\' 3"  (1.6 m), weight 165 lb (74.8 kg), SpO2 97 %. Body mass index is 29.23 kg/m.  General: Cooperative, alert, well developed, in no acute distress. HEENT: Conjunctivae and lids unremarkable. Cardiovascular: Regular rhythm.  Lungs: Normal work of breathing. Neurologic: No focal deficits.   Lab Results  Component Value Date   CREATININE 0.95 10/06/2019   BUN 11 10/06/2019   NA 142 10/06/2019   K 4.3 10/06/2019   CL 103 10/06/2019   CO2 25 10/06/2019   Lab Results  Component Value Date   ALT 19 10/06/2019   AST 20 10/06/2019   ALKPHOS 77 10/06/2019   BILITOT 0.2 10/06/2019   Lab Results  Component Value Date   HGBA1C 6.5 (H) 10/06/2019   Lab Results  Component Value Date   INSULIN 13.7 10/06/2019   Lab Results  Component Value Date   TSH 0.471 10/06/2019   Lab Results  Component Value Date   CHOL 272 (H) 10/06/2019   HDL 54 10/06/2019   LDLCALC 194 (H) 10/06/2019   TRIG 133 10/06/2019   Lab Results  Component Value Date   WBC 5.2 10/06/2019   HGB 11.9 10/06/2019   HCT 38.6 10/06/2019   MCV 85 10/06/2019   PLT 308 10/06/2019   No results found for: IRON, TIBC, FERRITIN  Attestation Statements:   Reviewed by clinician on day of visit:  allergies, medications, problem list, medical history, surgical history, family history, social history, and previous encounter notes.  Time spent on visit including pre-visit chart review and post-visit charting and care was 29 minutes.   I, 10/08/2019, am acting as Ariel Carpenter for Energy manager, NP-C   I have reviewed the above documentation for accuracy and completeness, and I agree with the above. -  [employer], NP

## 2020-01-09 ENCOUNTER — Ambulatory Visit (INDEPENDENT_AMBULATORY_CARE_PROVIDER_SITE_OTHER): Payer: BC Managed Care – PPO | Admitting: Adult Health

## 2020-01-09 ENCOUNTER — Other Ambulatory Visit: Payer: Self-pay

## 2020-01-09 ENCOUNTER — Encounter (INDEPENDENT_AMBULATORY_CARE_PROVIDER_SITE_OTHER): Payer: Self-pay | Admitting: Adult Health

## 2020-01-09 VITALS — BP 142/78 | HR 67 | Temp 98.2°F | Ht 63.0 in | Wt 164.0 lb

## 2020-01-09 DIAGNOSIS — Z9189 Other specified personal risk factors, not elsewhere classified: Secondary | ICD-10-CM

## 2020-01-09 DIAGNOSIS — E118 Type 2 diabetes mellitus with unspecified complications: Secondary | ICD-10-CM | POA: Diagnosis not present

## 2020-01-09 DIAGNOSIS — E669 Obesity, unspecified: Secondary | ICD-10-CM

## 2020-01-09 DIAGNOSIS — I10 Essential (primary) hypertension: Secondary | ICD-10-CM

## 2020-01-09 DIAGNOSIS — E785 Hyperlipidemia, unspecified: Secondary | ICD-10-CM

## 2020-01-09 DIAGNOSIS — E1169 Type 2 diabetes mellitus with other specified complication: Secondary | ICD-10-CM

## 2020-01-09 DIAGNOSIS — E1159 Type 2 diabetes mellitus with other circulatory complications: Secondary | ICD-10-CM | POA: Diagnosis not present

## 2020-01-09 DIAGNOSIS — E559 Vitamin D deficiency, unspecified: Secondary | ICD-10-CM | POA: Diagnosis not present

## 2020-01-09 DIAGNOSIS — Z6830 Body mass index (BMI) 30.0-30.9, adult: Secondary | ICD-10-CM

## 2020-01-10 LAB — COMPREHENSIVE METABOLIC PANEL
ALT: 16 IU/L (ref 0–32)
AST: 17 IU/L (ref 0–40)
Albumin/Globulin Ratio: 1.3 (ref 1.2–2.2)
Albumin: 4 g/dL (ref 3.8–4.8)
Alkaline Phosphatase: 72 IU/L (ref 44–121)
BUN/Creatinine Ratio: 16 (ref 12–28)
BUN: 15 mg/dL (ref 8–27)
Bilirubin Total: 0.2 mg/dL (ref 0.0–1.2)
CO2: 26 mmol/L (ref 20–29)
Calcium: 9.4 mg/dL (ref 8.7–10.3)
Chloride: 105 mmol/L (ref 96–106)
Creatinine, Ser: 0.91 mg/dL (ref 0.57–1.00)
GFR calc Af Amer: 78 mL/min/{1.73_m2} (ref >59)
GFR calc non Af Amer: 67 mL/min/{1.73_m2} (ref >59)
Globulin, Total: 3 g/dL (ref 1.5–4.5)
Glucose: 90 mg/dL (ref 65–99)
Potassium: 4.3 mmol/L (ref 3.5–5.2)
Sodium: 144 mmol/L (ref 134–144)
Total Protein: 7 g/dL (ref 6.0–8.5)

## 2020-01-10 LAB — INSULIN, RANDOM: INSULIN: 16.4 u[IU]/mL (ref 2.6–24.9)

## 2020-01-10 LAB — VITAMIN D 25 HYDROXY (VIT D DEFICIENCY, FRACTURES): Vit D, 25-Hydroxy: 32.7 ng/mL (ref 30.0–100.0)

## 2020-01-10 LAB — HEMOGLOBIN A1C
Est. average glucose Bld gHb Est-mCnc: 128 mg/dL
Hgb A1c MFr Bld: 6.1 % — ABNORMAL HIGH (ref 4.8–5.6)

## 2020-01-10 LAB — LIPID PANEL
Chol/HDL Ratio: 4.6 ratio — ABNORMAL HIGH (ref 0.0–4.4)
Cholesterol, Total: 232 mg/dL — ABNORMAL HIGH (ref 100–199)
HDL: 50 mg/dL (ref >39)
LDL Chol Calc (NIH): 161 mg/dL — ABNORMAL HIGH (ref 0–99)
Triglycerides: 115 mg/dL (ref 0–149)
VLDL Cholesterol Cal: 21 mg/dL (ref 5–40)

## 2020-01-11 NOTE — Progress Notes (Signed)
Chief Complaint:   OBESITY MYEISHA KRUSER is here to discuss her progress with her obesity treatment plan along with follow-up of her obesity related diagnoses. Jakalyn is on the Category 1 Plan and states she is following her eating plan approximately 75% of the time. Fantasha states she is exercising 0 minutes 0 times per week.  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 142 people attended and she personally baked the cheesecakes for the event - impressive! She ate prior to the event - baked chicken with water. She did not eat at the event, however, she did enjoy 2 cocktails during the evening.  Subjective:   Hyperlipidemia associated with type 2 diabetes mellitus (HCC). 10/06/2019 total and LDL levels were above goal. Fletcher has declined statin therapy in the past.  Vitamin D deficiency. Mykela is on Ergocalciferol. No nausea, vomiting, or muscle weakness.    Ref. Range 10/06/2019 12:23  Vitamin D, 25-Hydroxy Latest Ref Range: 30.0 - 100.0 ng/mL 10.8 (L)   Type 2 diabetes with complication (HCC). 10/06/2019 A1c 6.5, insulin level 13.7, and blood glucose level was normal at 91.  Lab Results  Component Value Date   HGBA1C 6.5 (H) 10/06/2019   Lab Results  Component Value Date   INSULIN 13.7 10/06/2019   Hypertension associated with type 2 diabetes mellitus (HCC). Blood pressure is stable. Prarthana is on lisinopril 10 mg daily and denies acute cardiac symptoms.  BP Readings from Last 3 Encounters:  01/09/20 (!) 142/78  12/21/19 129/82  11/30/19 126/76   Lab Results  Component Value Date   CREATININE 0.95 10/06/2019   CREATININE 1.00 04/15/2012   At risk for heart disease. Sami is at a higher than average risk for  cardiovascular disease due to hyperlipidemia, hypertension, type II diabetes mellitus, and obesity.   Assessment/Plan:   Hyperlipidemia associated with type 2 diabetes mellitus (HCC). Cardiovascular risk and specific lipid/LDL goals reviewed.  We discussed several lifestyle modifications today and Mckynlee will continue to work on diet, exercise and weight loss efforts. Orders and follow up as documented in patient record. Labs will be checked today.  Counseling Intensive lifestyle modifications are the first line treatment for this issue. . Dietary changes: Increase soluble fiber. Decrease simple carbohydrates. . Exercise changes: Moderate to vigorous-intensity aerobic activity 150 minutes per week if tolerated. . Lipid-lowering medications: see documented in medical record.  Vitamin D deficiency. Low Vitamin D level contributes to fatigue and are associated with obesity, breast, and colon cancer. VITAMIN D 25 Hydroxy (Vit-D Deficiency, Fractures) level will be checked today.   Type 2 diabetes with complication (HCC). Good blood sugar control is important to decrease the likelihood of diabetic complications such as nephropathy, neuropathy, limb loss, blindness, coronary artery disease, and death. Intensive lifestyle modification including diet, exercise and weight loss are the first line of treatment for diabetes. Labs will be checked today.  Hypertension associated with type 2 diabetes mellitus (HCC). Temima is working on healthy weight loss and exercise to improve blood pressure control. We will watch for signs of hypotension as she continues her lifestyle modifications. Labs will be checked today.  At risk for heart disease. Linh was given approximately 15 minutes of coronary artery disease prevention counseling today. She is 64 y.o. female  and has risk factors for heart disease including obesity. We discussed intensive lifestyle modifications today with an emphasis on specific weight loss  instructions and strategies.   Repetitive spaced learning was employed today to elicit superior memory formation and behavioral change.  Class 1 obesity with serious comorbidity and body mass index (BMI) of 30.0 to 30.9 in adult, unspecified obesity type - BMI greater than 30 at start of program.  Jaretzi is currently in the action stage of change. As such, her goal is to continue with weight loss efforts. She has agreed to the Category 1 Plan.   Exercise goals: No exercise has been prescribed at this time.  Behavioral modification strategies: increasing lean protein intake, meal planning and cooking strategies and planning for success.  Kathyrn has agreed to follow-up with our clinic in 2 weeks. She was informed of the importance of frequent follow-up visits to maximize her success with intensive lifestyle modifications for her multiple health conditions.   Glender was informed we would discuss her lab results at her next visit unless there is a critical issue that needs to be addressed sooner. Shanitha agreed to keep her next visit at the agreed upon time to discuss these results.  Objective:   Blood pressure (!) 142/78, pulse 67, temperature 98.2 F (36.8 C), height 5\' 3"  (1.6 m), weight 164 lb (74.4 kg), SpO2 96 %. Body mass index is 29.05 kg/m.  General: Cooperative, alert, well developed, in no acute distress. HEENT: Conjunctivae and lids unremarkable. Cardiovascular: Regular rhythm.  Lungs: Normal work of breathing. Neurologic: No focal deficits.   Lab Results  Component Value Date   HGBA1C 6.5 (H) 10/06/2019   Lab Results  Component Value Date   INSULIN 13.7 10/06/2019   Lab Results  Component Value Date   TSH 0.471 10/06/2019   Lab Results  Component Value Date   WBC 5.2 10/06/2019   HGB 11.9 10/06/2019   HCT 38.6 10/06/2019   MCV 85 10/06/2019   PLT 308 10/06/2019   No results found for: IRON, TIBC, FERRITIN  Attestation Statements:   Reviewed by clinician on  day of visit: allergies, medications, problem list, medical history, surgical history, family history, social history, and previous encounter notes.  I, 10/08/2019, am acting as Marianna Payment for Energy manager, NP-C   I have reviewed the above documentation for accuracy and completeness, and I agree with the above. [employer], NP

## 2020-01-26 ENCOUNTER — Other Ambulatory Visit: Payer: Self-pay

## 2020-01-26 ENCOUNTER — Ambulatory Visit (INDEPENDENT_AMBULATORY_CARE_PROVIDER_SITE_OTHER): Payer: BC Managed Care – PPO | Admitting: Adult Health

## 2020-01-26 ENCOUNTER — Encounter (INDEPENDENT_AMBULATORY_CARE_PROVIDER_SITE_OTHER): Payer: Self-pay | Admitting: Adult Health

## 2020-01-26 VITALS — BP 167/74 | HR 69 | Temp 98.1°F | Ht 63.0 in | Wt 163.0 lb

## 2020-01-26 DIAGNOSIS — E1169 Type 2 diabetes mellitus with other specified complication: Secondary | ICD-10-CM | POA: Diagnosis not present

## 2020-01-26 DIAGNOSIS — Z6830 Body mass index (BMI) 30.0-30.9, adult: Secondary | ICD-10-CM

## 2020-01-26 DIAGNOSIS — E1159 Type 2 diabetes mellitus with other circulatory complications: Secondary | ICD-10-CM

## 2020-01-26 DIAGNOSIS — E118 Type 2 diabetes mellitus with unspecified complications: Secondary | ICD-10-CM

## 2020-01-26 DIAGNOSIS — E559 Vitamin D deficiency, unspecified: Secondary | ICD-10-CM | POA: Diagnosis not present

## 2020-01-26 DIAGNOSIS — Z9189 Other specified personal risk factors, not elsewhere classified: Secondary | ICD-10-CM

## 2020-01-26 DIAGNOSIS — I152 Hypertension secondary to endocrine disorders: Secondary | ICD-10-CM

## 2020-01-26 DIAGNOSIS — E669 Obesity, unspecified: Secondary | ICD-10-CM

## 2020-01-26 DIAGNOSIS — E66811 Obesity, class 1: Secondary | ICD-10-CM

## 2020-01-26 DIAGNOSIS — E785 Hyperlipidemia, unspecified: Secondary | ICD-10-CM

## 2020-01-26 MED ORDER — METFORMIN HCL 500 MG PO TABS: 500.0000 mg | ORAL_TABLET | Freq: Every day | ORAL | 0 refills | Status: DC

## 2020-01-26 MED ORDER — ROSUVASTATIN CALCIUM 10 MG PO TABS: ORAL_TABLET | ORAL | 0 refills | Status: DC

## 2020-01-26 MED ORDER — VITAMIN D (ERGOCALCIFEROL) 1.25 MG (50000 UNIT) PO CAPS: 50000.0000 [IU] | ORAL_CAPSULE | ORAL | 0 refills | Status: DC

## 2020-01-26 NOTE — Progress Notes (Signed)
Chief Complaint:   OBESITY Ariel Carpenter is here to discuss her progress with her obesity treatment plan along with follow-up of her obesity related diagnoses. Ariel Carpenter is on the Category 1 Plan and states she is following her eating plan approximately 70% of the time. Ariel Carpenter states she is walking 30 minutes 1 time per week.  Today's visit was #: 7 Starting weight: 172 lbs Starting date: 10/06/2019 Today's weight: 163 lbs Today's date: 01/26/2020 Total lbs lost to date: 9 Total lbs lost since last in-office visit: 1  Interim History: Ariel Carpenter has eaten off plan when at community events or funerals for family/friends. Sadly, she has lost 2 aunts and 3 adolescents in her community recently. When eating on plan, she denies polyphagia or excessive cravings.  Subjective:   Type 2 diabetes with complication without long term insulin use(HCC). 01/09/2020 A1c 6.1, improved from 6.5 on 10/06/2019. Ariel Carpenter is on metformin 500 mg daily, which she is tolerating well.   Lab Results  Component Value Date   HGBA1C 6.1 (H) 01/09/2020   HGBA1C 6.5 (H) 10/06/2019   Lab Results  Component Value Date   LDLCALC 161 (H) 01/09/2020   CREATININE 0.91 01/09/2020   Lab Results  Component Value Date   INSULIN 16.4 01/09/2020   INSULIN 13.7 10/06/2019   Hypertension associated with type 2 diabetes mellitus (HCC). Blood pressure is elevated today. Ariel Carpenter has not taken lisinopril 10 mg for the last 2 days. She denies acute cardiac symptoms.  BP Readings from Last 3 Encounters:  01/26/20 (!) 167/74  01/09/20 (!) 142/78  12/21/19 129/82   Lab Results  Component Value Date   CREATININE 0.91 01/09/2020   CREATININE 0.95 10/06/2019   CREATININE 1.00 04/15/2012   Hyperlipidemia associated with type 2 diabetes mellitus (HCC). 01/09/2020 lipid panel showed total cholesterol 232 and LDL 161, which is well above goal of 70. ASCVD risk 38.8%. We discussed at length risks versus benefits of statin therapy.  Ariel Carpenter is agreeable to Crestor twice weekly. CMP 01/09/2020 showed LFT's within normal limits.  Lab Results  Component Value Date   CHOL 232 (H) 01/09/2020   HDL 50 01/09/2020   LDLCALC 161 (H) 01/09/2020   TRIG 115 01/09/2020   CHOLHDL 4.6 (H) 01/09/2020   Lab Results  Component Value Date   ALT 16 01/09/2020   AST 17 01/09/2020   ALKPHOS 72 01/09/2020   BILITOT 0.2 01/09/2020   The 10-year ASCVD risk score [name], et al., 2013) is: 38.8%   Values used to calculate the score:     Age: 64 years     Sex: Female     Is Non-Hispanic African American: Yes     Diabetic: Yes     Tobacco smoker: No     Systolic Blood Pressure: 167 mmHg     Is BP treated: Yes     HDL Cholesterol: 50 mg/dL     Total Cholesterol: 232 mg/dL  Vitamin D deficiency. Last Vitamin D level still below goal at 32.7 on 01/09/2020.   Ref. Range 01/09/2020 10:33  Vitamin D, 25-Hydroxy Latest Ref Range: 30.0 - 100.0 ng/mL 32.7   At risk for heart disease. [name] is at a higher than average risk for cardiovascular disease due to hyperlipidemia, type II diabetes, and obesity.   Assessment/Plan:   Type 2 diabetes with complication without long term insulin use(HCC). Good blood sugar control is important to decrease the likelihood of diabetic complications such as nephropathy, neuropathy, limb loss,  blindness, coronary artery disease, and death. Intensive lifestyle modification including diet, exercise and weight loss are the first line of treatment for diabetes. Refill was given for metFORMIN (GLUCOPHAGE) 500 MG tablet #30 with 0 refills. Labs will be checked every 3 months.   Hypertension associated with type 2 diabetes mellitus (HCC). Ariel Carpenter is working on healthy weight loss and exercise to improve blood pressure control. We will watch for signs of hypotension as she continues her lifestyle modifications. She will take lisinopril 10 mg daily as directed.   Hyperlipidemia associated with type 2 diabetes  mellitus (HCC).  rosuvastatin (CRESTOR) 10 MG tablet  Vitamin D deficiency. Low Vitamin D level contributes to fatigue and are associated with obesity, breast, and colon cancer. She was given a refill on her Vitamin D, Ergocalciferol, (DRISDOL) 1.25 MG (50000 UNIT) CAPS capsule every week #4 with 0 refills and will follow-up for routine testing of Vitamin D every 3 months.   At risk for heart disease. Ariel Carpenter was given approximately 15 minutes of coronary artery disease prevention counseling today. She is 64 y.o. female and has risk factors for heart disease including obesity. We discussed intensive lifestyle modifications today with an emphasis on specific weight loss instructions and strategies.   Repetitive spaced learning was employed today to elicit superior memory formation and behavioral change.  Class 1 obesity with serious comorbidity and body mass index (BMI) of 30.0 to 30.9 in adult, unspecified obesity type - BMI greater than 30 at the start of program.   Ariel Carpenter is currently in the action stage of change. As such, her goal is to continue with weight loss efforts. She has agreed to the Category 1 Plan.   Exercise goals: Ariel Carpenter will continue walking 30 minutes 1 time per week.  Behavioral modification strategies: increasing lean protein intake, decreasing simple carbohydrates, decreasing eating out, meal planning and cooking strategies and planning for success.  Ariel Carpenter has agreed to follow-up with our clinic in 2 weeks. She was informed of the importance of frequent follow-up visits to maximize her success with intensive lifestyle modifications for her multiple health conditions.   Objective:   Blood pressure (!) 167/74, pulse 69, temperature 98.1 F (36.7 C), height 5\' 3"  (1.6 m), weight 163 lb (73.9 kg), SpO2 97 %. Body mass index is 28.87 kg/m.  General: Cooperative, alert, well developed, in no acute distress. HEENT: Conjunctivae and lids unremarkable. Cardiovascular: Regular  rhythm.  Lungs: Normal work of breathing. Neurologic: No focal deficits.   Lab Results  Component Value Date   CREATININE 0.91 01/09/2020   BUN 15 01/09/2020   NA 144 01/09/2020   K 4.3 01/09/2020   CL 105 01/09/2020   CO2 26 01/09/2020   Lab Results  Component Value Date   ALT 16 01/09/2020   AST 17 01/09/2020   ALKPHOS 72 01/09/2020   BILITOT 0.2 01/09/2020   Lab Results  Component Value Date   HGBA1C 6.1 (H) 01/09/2020   HGBA1C 6.5 (H) 10/06/2019   Lab Results  Component Value Date   INSULIN 16.4 01/09/2020   INSULIN 13.7 10/06/2019   Lab Results  Component Value Date   TSH 0.471 10/06/2019   Lab Results  Component Value Date   CHOL 232 (H) 01/09/2020   HDL 50 01/09/2020   LDLCALC 161 (H) 01/09/2020   TRIG 115 01/09/2020   CHOLHDL 4.6 (H) 01/09/2020   Lab Results  Component Value Date   WBC 5.2 10/06/2019   HGB 11.9 10/06/2019   HCT 38.6 10/06/2019  MCV 85 10/06/2019   PLT 308 10/06/2019   No results found for: IRON, TIBC, FERRITIN  Attestation Statements:   Reviewed by clinician on day of visit: allergies, medications, problem list, medical history, surgical history, family history, social history, and previous encounter notes.  I, Marianna Payment, am acting as Energy manager for [employer], NP-C   I have reviewed the above documentation for accuracy and completeness, and I agree with the above. -  Katy d. Danford, NP-C

## 2020-02-14 ENCOUNTER — Other Ambulatory Visit: Payer: Self-pay

## 2020-02-14 ENCOUNTER — Ambulatory Visit (INDEPENDENT_AMBULATORY_CARE_PROVIDER_SITE_OTHER): Payer: BC Managed Care – PPO | Admitting: Adult Health

## 2020-02-14 ENCOUNTER — Encounter (INDEPENDENT_AMBULATORY_CARE_PROVIDER_SITE_OTHER): Payer: Self-pay | Admitting: Adult Health

## 2020-02-14 VITALS — BP 147/93 | HR 72 | Temp 98.1°F | Ht 63.0 in | Wt 163.0 lb

## 2020-02-14 DIAGNOSIS — E1169 Type 2 diabetes mellitus with other specified complication: Secondary | ICD-10-CM | POA: Diagnosis not present

## 2020-02-14 DIAGNOSIS — E1159 Type 2 diabetes mellitus with other circulatory complications: Secondary | ICD-10-CM

## 2020-02-14 DIAGNOSIS — I152 Hypertension secondary to endocrine disorders: Secondary | ICD-10-CM

## 2020-02-14 DIAGNOSIS — E559 Vitamin D deficiency, unspecified: Secondary | ICD-10-CM

## 2020-02-14 DIAGNOSIS — E669 Obesity, unspecified: Secondary | ICD-10-CM

## 2020-02-14 DIAGNOSIS — Z6830 Body mass index (BMI) 30.0-30.9, adult: Secondary | ICD-10-CM

## 2020-02-14 DIAGNOSIS — E785 Hyperlipidemia, unspecified: Secondary | ICD-10-CM

## 2020-02-15 NOTE — Progress Notes (Signed)
Chief Complaint:   OBESITY Ariel Carpenter is here to discuss her progress with her obesity treatment plan along with follow-up of her obesity related diagnoses. Ariel Carpenter is on the Category 1 Plan and states she is following her eating plan approximately 60% of the time. Ariel Carpenter states she is exercising 0 minutes 0 times per week.  Today's visit was #: 8 Starting weight: 172 lbs Starting date: 10/06/2019 Today's weight: 163 lbs Today's date: 02/14/2020 Total lbs lost to date: 9 Total lbs lost since last in-office visit: 0  Interim History: Ariel Carpenter states "I fell off the wagon." She went to a Rib [place] and enjoyed ribs, potatoes and wine, however, she avoided the cake. She will celebrate [employer] homecoming this weekend and she will only be attending the football game, so staying on track will be achievable.  Subjective:   Type 2 diabetes mellitus with other specified complication, without long-term current use of insulin (HCC). 01/09/2020 A1c 6.1, improved from 6.5 on 10/06/2019. Ariel Carpenter is on metformin at breakfast and denies GI upset.  Lab Results  Component Value Date   HGBA1C 6.1 (H) 01/09/2020   HGBA1C 6.5 (H) 10/06/2019   Lab Results  Component Value Date   LDLCALC 161 (H) 01/09/2020   CREATININE 0.91 01/09/2020   Lab Results  Component Value Date   INSULIN 16.4 01/09/2020   INSULIN 13.7 10/06/2019   Hypertension associated with type 2 diabetes mellitus (HCC). Blood pressure is slightly above goal; heart rate stable. Ariel Carpenter reports taking lisinopril 10 mg QHS and denies cardiac symptoms.  BP Readings from Last 3 Encounters:  02/14/20 (!) 147/93  01/26/20 (!) 167/74  01/09/20 (!) 142/78   Lab Results  Component Value Date   CREATININE 0.91 01/09/2020   CREATININE 0.95 10/06/2019   CREATININE 1.00 04/15/2012   Vitamin D deficiency. Vitamin D level on 01/09/2020 was 32.7, improved from last check, however, still below goal of 50. Ariel Carpenter is on Ergocalciferol. No  nausea, vomiting, or muscle weakness.    Ref. Range 01/09/2020 10:33  Vitamin D, 25-Hydroxy Latest Ref Range: 30.0 - 100.0 ng/mL 32.7   Hyperlipidemia associated with type 2 diabetes mellitus (HCC). Ariel Carpenter's first dose of rosuvastatin was 02/10/2020. She was unable to obtain statin sooner due to insurance coverage. She will take rosuvastatin 10 mg on Saturdays and Wednesdays.  Lab Results  Component Value Date   CHOL 232 (H) 01/09/2020   HDL 50 01/09/2020   LDLCALC 161 (H) 01/09/2020   TRIG 115 01/09/2020   CHOLHDL 4.6 (H) 01/09/2020   Lab Results  Component Value Date   ALT 16 01/09/2020   AST 17 01/09/2020   ALKPHOS 72 01/09/2020   BILITOT 0.2 01/09/2020   The 10-year ASCVD risk score [name], et al., 2013) is: 29.6%   Values used to calculate the score:     Age: 62 years     Sex: Female     Is Non-Hispanic African American: Yes     Diabetic: Yes     Tobacco smoker: No     Systolic Blood Pressure: 147 mmHg     Is BP treated: Yes     HDL Cholesterol: 50 mg/dL     Total Cholesterol: 232 mg/dL  Assessment/Plan:   Type 2 diabetes mellitus with other specified complication, without long-term current use of insulin (HCC). Good blood sugar control is important to decrease the likelihood of diabetic complications such as nephropathy, neuropathy, limb loss, blindness, coronary artery disease, and death. Intensive  lifestyle modification including diet, exercise and weight loss are the first line of treatment for diabetes. Labs will be checked at the end of December 2021.  Hypertension associated with type 2 diabetes mellitus (HCC). Ariel Carpenter is working on healthy weight loss and exercise to improve blood pressure control. We will watch for signs of hypotension as she continues her lifestyle modifications. Ariel Carpenter will check blood pressure and heart rate at home and bring readings to her next office visit.  Vitamin D deficiency. Low Vitamin D level contributes to fatigue and are  associated with obesity, breast, and colon cancer. She agrees to continue to take Ergocalciferol as directed (no medication refill today) and will follow-up for routine testing of Vitamin D, at least 2-3 times per year to avoid over-replacement.  Hyperlipidemia associated with type 2 diabetes mellitus (HCC). Cardiovascular risk and specific lipid/LDL goals reviewed.  We discussed several lifestyle modifications today and Ariel Carpenter will continue to work on diet, exercise and weight loss efforts. Orders and follow up as documented in patient record. Ariel Carpenter will remain well hydrated. Labs will be checked in 6 weeks.  Counseling Intensive lifestyle modifications are the first line treatment for this issue.  Dietary changes: Increase soluble fiber. Decrease simple carbohydrates.  Exercise changes: Moderate to vigorous-intensity aerobic activity 150 minutes per week if tolerated.  Lipid-lowering medications: see documented in medical record.  Class 1 obesity with serious comorbidity and body mass index (BMI) of 30.0 to 30.9 in adult, unspecified obesity type - BMI greater than 30 at the start of program.  Ariel Carpenter is currently in the action stage of change. As such, her goal is to continue with weight loss efforts. She has agreed to the Category 1 Plan.   Exercise goals: All adults should avoid inactivity. Some physical activity is better than none, and adults who participate in any amount of physical activity gain some health benefits.  Behavioral modification strategies: increasing lean protein intake, meal planning and cooking strategies, better snacking choices, celebration eating strategies and planning for success.  Ariel Carpenter has agreed to follow-up with our clinic in 2 weeks. She was informed of the importance of frequent follow-up visits to maximize her success with intensive lifestyle modifications for her multiple health conditions.   Objective:   Blood pressure (!) 147/93, pulse 72, temperature  98.1 F (36.7 C), height 5\' 3"  (1.6 m), weight 163 lb (73.9 kg), SpO2 98 %. Body mass index is 28.87 kg/m.  General: Cooperative, alert, well developed, in no acute distress. HEENT: Conjunctivae and lids unremarkable. Cardiovascular: Regular rhythm.  Lungs: Normal work of breathing. Neurologic: No focal deficits.   Lab Results  Component Value Date   CREATININE 0.91 01/09/2020   BUN 15 01/09/2020   NA 144 01/09/2020   K 4.3 01/09/2020   CL 105 01/09/2020   CO2 26 01/09/2020   Lab Results  Component Value Date   ALT 16 01/09/2020   AST 17 01/09/2020   ALKPHOS 72 01/09/2020   BILITOT 0.2 01/09/2020   Lab Results  Component Value Date   HGBA1C 6.1 (H) 01/09/2020   HGBA1C 6.5 (H) 10/06/2019   Lab Results  Component Value Date   INSULIN 16.4 01/09/2020   INSULIN 13.7 10/06/2019   Lab Results  Component Value Date   TSH 0.471 10/06/2019   Lab Results  Component Value Date   CHOL 232 (H) 01/09/2020   HDL 50 01/09/2020   LDLCALC 161 (H) 01/09/2020   TRIG 115 01/09/2020   CHOLHDL 4.6 (H) 01/09/2020  Lab Results  Component Value Date   WBC 5.2 10/06/2019   HGB 11.9 10/06/2019   HCT 38.6 10/06/2019   MCV 85 10/06/2019   PLT 308 10/06/2019   No results found for: IRON, TIBC, FERRITIN  Attestation Statements:   Reviewed by clinician on day of visit: allergies, medications, problem list, medical history, surgical history, family history, social history, and previous encounter notes.  Time spent on visit including pre-visit chart review and post-visit charting and care was 28 minutes.   I, Marianna Payment, am acting as Energy manager for [employer], NP-C   I have reviewed the above documentation for accuracy and completeness, and I agree with the above. -  Akirah Storck d. Arran Fessel, NP-C

## 2020-02-29 ENCOUNTER — Ambulatory Visit
Admission: RE | Admit: 2020-02-29 | Discharge: 2020-02-29 | Disposition: A | Discharge status: Home / Self Care | Payer: BC Managed Care – PPO | Source: Ambulatory Visit | Attending: Internal Medicine | Admitting: Internal Medicine

## 2020-02-29 ENCOUNTER — Other Ambulatory Visit: Payer: Self-pay

## 2020-02-29 DIAGNOSIS — Z1231 Encounter for screening mammogram for malignant neoplasm of breast: Secondary | ICD-10-CM

## 2020-03-01 ENCOUNTER — Ambulatory Visit (INDEPENDENT_AMBULATORY_CARE_PROVIDER_SITE_OTHER): Payer: BC Managed Care – PPO | Admitting: Adult Health

## 2020-03-01 ENCOUNTER — Encounter (INDEPENDENT_AMBULATORY_CARE_PROVIDER_SITE_OTHER): Payer: Self-pay | Admitting: Adult Health

## 2020-03-01 VITALS — BP 162/98 | HR 71 | Temp 98.1°F | Ht 63.0 in | Wt 163.0 lb

## 2020-03-01 DIAGNOSIS — E669 Obesity, unspecified: Secondary | ICD-10-CM | POA: Diagnosis not present

## 2020-03-01 DIAGNOSIS — E118 Type 2 diabetes mellitus with unspecified complications: Secondary | ICD-10-CM | POA: Diagnosis not present

## 2020-03-01 DIAGNOSIS — Z9189 Other specified personal risk factors, not elsewhere classified: Secondary | ICD-10-CM | POA: Diagnosis not present

## 2020-03-01 DIAGNOSIS — E559 Vitamin D deficiency, unspecified: Secondary | ICD-10-CM

## 2020-03-01 DIAGNOSIS — Z6830 Body mass index (BMI) 30.0-30.9, adult: Secondary | ICD-10-CM

## 2020-03-01 DIAGNOSIS — I152 Hypertension secondary to endocrine disorders: Secondary | ICD-10-CM

## 2020-03-01 DIAGNOSIS — E1159 Type 2 diabetes mellitus with other circulatory complications: Secondary | ICD-10-CM | POA: Diagnosis not present

## 2020-03-01 MED ORDER — VITAMIN D (ERGOCALCIFEROL) 1.25 MG (50000 UNIT) PO CAPS: 50000.0000 [IU] | ORAL_CAPSULE | ORAL | 0 refills | Status: DC

## 2020-03-01 MED ORDER — METFORMIN HCL 500 MG PO TABS: 500.0000 mg | ORAL_TABLET | Freq: Every day | ORAL | 0 refills | Status: DC

## 2020-03-05 NOTE — Progress Notes (Signed)
Chief Complaint:   OBESITY Ariel Carpenter is here to discuss her progress with her obesity treatment plan along with follow-up of her obesity related diagnoses. Ariel Carpenter is on the Category 1 Plan and states she is following her eating plan approximately 0% of the time. Ariel Carpenter states she is exercising 0 minutes 0 times per week.  Today's visit was #: 9 Starting weight: 172 lbs Starting date: 10/06/2019 Today's weight: 163 lbs Today's date: 03/01/2020 Total lbs lost to date: 9 Total lbs lost since last in-office visit: 0  Interim History: Ariel Carpenter is under significant stress. She has been in [place] the last 4 days supporting her friend after the death of her son. She left town abruptly and did not take her medications and has been without lisinopril for 4 days. She denies cardiac symptoms. She would like to try a different/less structured meal plan for a few weeks.  Subjective:   Vitamin D deficiency. Last Vitamin D level was 32.7 on 01/09/2020, below goal of 50. Ariel Carpenter is on Ergocalciferol. No nausea, vomiting, or muscle weakness.    Ref. Range 01/09/2020 10:33  Vitamin D, 25-Hydroxy Latest Ref Range: 30.0 - 100.0 ng/mL 32.7   Type 2 diabetes with complication without long term insulin use(HCC). A1c is steadily improving. Ariel Carpenter is on metformin 500 mg daily and denies GI upset.  Lab Results  Component Value Date   HGBA1C 6.1 (H) 01/09/2020   HGBA1C 6.5 (H) 10/06/2019   Lab Results  Component Value Date   LDLCALC 161 (H) 01/09/2020   CREATININE 0.91 01/09/2020   Lab Results  Component Value Date   INSULIN 16.4 01/09/2020   INSULIN 13.7 10/06/2019   Hypertension associated with type 2 diabetes mellitus (HCC). Blood pressure is well above goal - she has not taken lisinopril 10 mg daily for 4 days. She denies chest pain, dyspnea, or palpitations. She denies tobacco/vape use.  BP Readings from Last 3 Encounters:  03/01/20 (!) 162/98  02/14/20 (!) 147/93  01/26/20 (!)  167/74   Lab Results  Component Value Date   CREATININE 0.91 01/09/2020   CREATININE 0.95 10/06/2019   CREATININE 1.00 04/15/2012   At risk for heart disease. Ariel Carpenter is at a higher than average risk for cardiovascular disease due to hypertension, hyperlipidemia, and obesity.   Assessment/Plan:   Vitamin D deficiency. Low Vitamin D level contributes to fatigue and are associated with obesity, breast, and colon cancer. She was given a refill on her Vitamin D, Ergocalciferol, (DRISDOL) 1.25 MG (50000 UNIT) CAPS capsule every week #4 with 0 refills and will follow-up for routine testing of Vitamin D, at least 2-3 times per year to avoid over-replacement.   Type 2 diabetes with complication without long term insulin use(HCC). Good blood sugar control is important to decrease the likelihood of diabetic complications such as nephropathy, neuropathy, limb loss, blindness, coronary artery disease, and death. Intensive lifestyle modification including diet, exercise and weight loss are the first line of treatment for diabetes. Refill was given for metFORMIN (GLUCOPHAGE) 500 MG tablet #30 with 0 refills.  Hypertension associated with type 2 diabetes mellitus (HCC). Ariel Carpenter is working on healthy weight loss and exercise to improve blood pressure control. We will watch for signs of hypotension as she continues her lifestyle modifications. Ariel Carpenter will immediately take lisinopril upon returning home. If red flag symptoms develop, she will seek immediate medical assistance. The patient communicated understanding and agreement.  At risk for heart disease. Ariel Carpenter was given approximately 15 minutes of  coronary artery disease prevention counseling today. She is 64 y.o. female and has risk factors for heart disease including obesity. We discussed intensive lifestyle modifications today with an emphasis on specific weight loss instructions and strategies.   Repetitive spaced learning was employed today to elicit  superior memory formation and behavioral change.  Class 1 obesity with serious comorbidity and body mass index (BMI) of 30.0 to 30.9 in adult, unspecified obesity type - BMI greater than 30 at start of program.  Ariel Carpenter is currently in the action stage of change. As such, her goal is to continue with weight loss efforts. She has agreed to practicing portion control and making smarter food choices, such as increasing vegetables and decreasing simple carbohydrates.   Handouts were provided on Recipes, Smart Fruit, and Thanksgiving.  Exercise goals: No exercise has been prescribed at this time.  Behavioral modification strategies: increasing lean protein intake, increasing water intake, meal planning and cooking strategies, better snacking choices and planning for success.  Ariel Carpenter has agreed to follow-up with our clinic in 3 weeks. She was informed of the importance of frequent follow-up visits to maximize her success with intensive lifestyle modifications for her multiple health conditions.   Objective:   Blood pressure (!) 162/98, pulse 71, temperature 98.1 F (36.7 C), height 5\' 3"  (1.6 m), weight 163 lb (73.9 kg), SpO2 98 %. Body mass index is 28.87 kg/m.  General: Cooperative, alert, well developed, in no acute distress. HEENT: Conjunctivae and lids unremarkable. Cardiovascular: Regular rhythm.  Lungs: Normal work of breathing. Neurologic: No focal deficits.   Lab Results  Component Value Date   CREATININE 0.91 01/09/2020   BUN 15 01/09/2020   NA 144 01/09/2020   K 4.3 01/09/2020   CL 105 01/09/2020   CO2 26 01/09/2020   Lab Results  Component Value Date   ALT 16 01/09/2020   AST 17 01/09/2020   ALKPHOS 72 01/09/2020   BILITOT 0.2 01/09/2020   Lab Results  Component Value Date   HGBA1C 6.1 (H) 01/09/2020   HGBA1C 6.5 (H) 10/06/2019   Lab Results  Component Value Date   INSULIN 16.4 01/09/2020   INSULIN 13.7 10/06/2019   Lab Results  Component Value Date   TSH  0.471 10/06/2019   Lab Results  Component Value Date   CHOL 232 (H) 01/09/2020   HDL 50 01/09/2020   LDLCALC 161 (H) 01/09/2020   TRIG 115 01/09/2020   CHOLHDL 4.6 (H) 01/09/2020   Lab Results  Component Value Date   WBC 5.2 10/06/2019   HGB 11.9 10/06/2019   HCT 38.6 10/06/2019   MCV 85 10/06/2019   PLT 308 10/06/2019   No results found for: IRON, TIBC, FERRITIN  Attestation Statements:   Reviewed by clinician on day of visit: allergies, medications, problem list, medical history, surgical history, family history, social history, and previous encounter notes.  I, 10/08/2019, am acting as Marianna Payment for Energy manager, NP-C   I have reviewed the above documentation for accuracy and completeness, and I agree with the above. -  Ariel Carpenter d. Ariel Pressman, NP-C

## 2020-03-22 ENCOUNTER — Other Ambulatory Visit: Payer: Self-pay

## 2020-03-22 ENCOUNTER — Encounter (INDEPENDENT_AMBULATORY_CARE_PROVIDER_SITE_OTHER): Payer: Self-pay | Admitting: Adult Health

## 2020-03-22 ENCOUNTER — Ambulatory Visit (INDEPENDENT_AMBULATORY_CARE_PROVIDER_SITE_OTHER): Payer: BC Managed Care – PPO | Admitting: Adult Health

## 2020-03-22 VITALS — BP 159/78 | HR 75 | Temp 98.1°F | Ht 63.0 in | Wt 164.0 lb

## 2020-03-22 DIAGNOSIS — E559 Vitamin D deficiency, unspecified: Secondary | ICD-10-CM | POA: Diagnosis not present

## 2020-03-22 DIAGNOSIS — E669 Obesity, unspecified: Secondary | ICD-10-CM

## 2020-03-22 DIAGNOSIS — Z6830 Body mass index (BMI) 30.0-30.9, adult: Secondary | ICD-10-CM

## 2020-03-22 DIAGNOSIS — E118 Type 2 diabetes mellitus with unspecified complications: Secondary | ICD-10-CM

## 2020-03-22 DIAGNOSIS — E1169 Type 2 diabetes mellitus with other specified complication: Secondary | ICD-10-CM

## 2020-03-22 DIAGNOSIS — Z9189 Other specified personal risk factors, not elsewhere classified: Secondary | ICD-10-CM | POA: Diagnosis not present

## 2020-03-22 DIAGNOSIS — E785 Hyperlipidemia, unspecified: Secondary | ICD-10-CM

## 2020-03-22 MED ORDER — METFORMIN HCL 500 MG PO TABS: 500.0000 mg | ORAL_TABLET | Freq: Every day | ORAL | 0 refills | Status: AC

## 2020-03-22 MED ORDER — VITAMIN D (ERGOCALCIFEROL) 1.25 MG (50000 UNIT) PO CAPS: 50000.0000 [IU] | ORAL_CAPSULE | ORAL | 0 refills | Status: AC

## 2020-03-22 NOTE — Progress Notes (Signed)
Chief Complaint:   OBESITY Ariel Carpenter is here to discuss her progress with her obesity treatment plan along with follow-up of her obesity related diagnoses. Ariel Carpenter is practicing portion control and making smarter food choices, such as increasing vegetables and decreasing simple carbohydrates and states she is following her eating plan approximately 65% of the time. Ariel Carpenter states she is exercising 0 minutes 0 times per week.  Today's visit was #: 10 Starting weight: 172 lbs Starting date: 10/06/2019 Today's weight: 164 lbs Today's date: 03/22/2020 Total lbs lost to date: 8 Total lbs lost since last in-office visit: 0  Interim History: For Thanksgiving-Ariel Carpenter enjoyed macaroni and cheese and dressing in moderation with larger servings of yams, Ariel Carpenter, and chicken. She avoided ham and the dessert table. She feels [facility] has really fit her busy lifestyle.   Subjective:   Vitamin D deficiency. Vitamin D level on 01/09/2020 was 32.7, below goal of 50. Ariel Carpenter is on Ergocalciferol. No nausea, vomiting, or muscle weakness.    Ref. Range 01/09/2020 10:33  Vitamin D, 25-Hydroxy Latest Ref Range: 30.0 - 100.0 ng/mL 32.7   Type 2 diabetes with complication without long term insulin use(HCC). 01/09/2020 blood glucose 90, A1c 6.1 (at goal) with an insulin level of 16.4. Ariel Carpenter is on metformin, which she is tolerating well.   Lab Results  Component Value Date   HGBA1C 6.1 (H) 01/09/2020   HGBA1C 6.5 (H) 10/06/2019   Lab Results  Component Value Date   LDLCALC 161 (H) 01/09/2020   CREATININE 0.91 01/09/2020   Lab Results  Component Value Date   INSULIN 16.4 01/09/2020   INSULIN 13.7 10/06/2019   Hyperlipidemia associated with type 2 diabetes mellitus (HCC). Lipid panel on 01/09/2020 showed total and LDL cholesterol levels well above goal. Ariel Carpenter started rosuvastatin 10 mg twice a week on 01/26/2020, which she is tolerating well.  Lab Results  Component Value Date   CHOL 232  (H) 01/09/2020   HDL 50 01/09/2020   LDLCALC 161 (H) 01/09/2020   TRIG 115 01/09/2020   CHOLHDL 4.6 (H) 01/09/2020   Lab Results  Component Value Date   ALT 16 01/09/2020   AST 17 01/09/2020   ALKPHOS 72 01/09/2020   BILITOT 0.2 01/09/2020   The 10-year ASCVD risk score [name], et al., 2013) is: 35%   Values used to calculate the score:     Age: 64 years     Sex: Female     Is Non-Hispanic African American: Yes     Diabetic: Yes     Tobacco smoker: No     Systolic Blood Pressure: 159 mmHg     Is BP treated: Yes     HDL Cholesterol: 50 mg/dL     Total Cholesterol: 232 mg/dL  At risk for heart disease. Ariel Carpenter is at a higher than average risk for cardiovascular disease due to type II diabetes, hyperlipidemia, and obesity.   Assessment/Plan:   Vitamin D deficiency. Low Vitamin D level contributes to fatigue and are associated with obesity, breast, and colon cancer. She was given a refill on her Vitamin D, Ergocalciferol, (DRISDOL) 1.25 MG (50000 UNIT) CAPS capsule every week #4 with 0 refills and will follow-up for routine testing of Vitamin D at her next office visit.   Type 2 diabetes with complication without long term insulin use(HCC). Good blood sugar control is important to decrease the likelihood of diabetic complications such as nephropathy, neuropathy, limb loss, blindness, coronary artery disease, and death. Intensive  lifestyle modification including diet, exercise and weight loss are the first line of treatment for diabetes. Refill was given for metFORMIN (GLUCOPHAGE) 500 MG tablet at breakfast #30 with 0 refills.  Hyperlipidemia associated with type 2 diabetes mellitus (HCC). Cardiovascular risk and specific lipid/LDL goals reviewed.  We discussed several lifestyle modifications today and Ariel Carpenter will continue to work on diet, exercise and weight loss efforts. Orders and follow up as documented in patient record. Labs will be checked at her next office  visit.  Counseling Intensive lifestyle modifications are the first line treatment for this issue. . Dietary changes: Increase soluble fiber. Decrease simple carbohydrates. . Exercise changes: Moderate to vigorous-intensity aerobic activity 150 minutes per week if tolerated. . Lipid-lowering medications: see documented in medical record.  At risk for heart disease. Ariel Carpenter was given approximately 15 minutes of coronary artery disease prevention counseling today. She is 64 y.o. female and has risk factors for heart disease including obesity. We discussed intensive lifestyle modifications today with an emphasis on specific weight loss instructions and strategies.   Repetitive spaced learning was employed today to elicit superior memory formation and behavioral change.   Class 1 obesity with serious comorbidity and body mass index (BMI) of 30.0 to 30.9 in adult, unspecified obesity type - BMI greater than 30 at the start of the program.  Ariel Carpenter is currently in the action stage of change. As such, her goal is to continue with weight loss efforts. She has agreed to practicing portion control and making smarter food choices, such as increasing vegetables and decreasing simple carbohydrates.   Labs will be checked at her next office visit.  Exercise goals: Ariel Carpenter will increase daily walking when in a safe area.  Behavioral modification strategies: increasing lean protein intake, meal planning and cooking strategies and planning for success.  Ariel Carpenter has agreed to follow-up with our clinic fasting in 3 weeks. She was informed of the importance of frequent follow-up visits to maximize her success with intensive lifestyle modifications for her multiple health conditions.   Objective:   Blood pressure (!) 159/78, pulse 75, temperature 98.1 F (36.7 C), height 5\' 3"  (1.6 m), weight 164 lb (74.4 kg), SpO2 98 %. Body mass index is 29.05 kg/m.  General: Cooperative, alert, well developed, in no acute  distress. HEENT: Conjunctivae and lids unremarkable. Cardiovascular: Regular rhythm.  Lungs: Normal work of breathing. Neurologic: No focal deficits.   Lab Results  Component Value Date   CREATININE 0.91 01/09/2020   BUN 15 01/09/2020   NA 144 01/09/2020   K 4.3 01/09/2020   CL 105 01/09/2020   CO2 26 01/09/2020   Lab Results  Component Value Date   ALT 16 01/09/2020   AST 17 01/09/2020   ALKPHOS 72 01/09/2020   BILITOT 0.2 01/09/2020   Lab Results  Component Value Date   HGBA1C 6.1 (H) 01/09/2020   HGBA1C 6.5 (H) 10/06/2019   Lab Results  Component Value Date   INSULIN 16.4 01/09/2020   INSULIN 13.7 10/06/2019   Lab Results  Component Value Date   TSH 0.471 10/06/2019   Lab Results  Component Value Date   CHOL 232 (H) 01/09/2020   HDL 50 01/09/2020   LDLCALC 161 (H) 01/09/2020   TRIG 115 01/09/2020   CHOLHDL 4.6 (H) 01/09/2020   Lab Results  Component Value Date   WBC 5.2 10/06/2019   HGB 11.9 10/06/2019   HCT 38.6 10/06/2019   MCV 85 10/06/2019   PLT 308 10/06/2019   No results  found for: IRON, TIBC, FERRITIN  Attestation Statements:   Reviewed by clinician on day of visit: allergies, medications, problem list, medical history, surgical history, family history, social history, and previous encounter notes.  I, Marianna Payment, am acting as Energy manager for [employer], NP-C   I have reviewed the above documentation for accuracy and completeness, and I agree with the above. -  Lindley Stachnik d. Erian Lariviere, NP-C

## 2020-03-26 ENCOUNTER — Other Ambulatory Visit (INDEPENDENT_AMBULATORY_CARE_PROVIDER_SITE_OTHER): Payer: Self-pay | Admitting: Adult Health

## 2020-03-26 DIAGNOSIS — E1169 Type 2 diabetes mellitus with other specified complication: Secondary | ICD-10-CM

## 2020-04-10 ENCOUNTER — Ambulatory Visit (INDEPENDENT_AMBULATORY_CARE_PROVIDER_SITE_OTHER): Payer: BC Managed Care – PPO | Admitting: Adult Health

## 2020-05-09 ENCOUNTER — Other Ambulatory Visit (INDEPENDENT_AMBULATORY_CARE_PROVIDER_SITE_OTHER): Payer: Self-pay | Admitting: Adult Health

## 2020-05-09 DIAGNOSIS — E559 Vitamin D deficiency, unspecified: Secondary | ICD-10-CM

## 2020-05-09 DIAGNOSIS — E118 Type 2 diabetes mellitus with unspecified complications: Secondary | ICD-10-CM

## 2020-05-09 DIAGNOSIS — E1169 Type 2 diabetes mellitus with other specified complication: Secondary | ICD-10-CM

## 2020-05-14 ENCOUNTER — Telehealth (INDEPENDENT_AMBULATORY_CARE_PROVIDER_SITE_OTHER): Payer: Self-pay

## 2020-05-14 NOTE — Telephone Encounter (Signed)
Patient is requesting refills for the (3) medications that are prescribed from our office, Vit D, Metformin and she can't remember the name of the 3rd  One.  Pharmacy is [facility] [address].  Her last visit was with Orpha Bur on 12/2.  She has no follow-up visit scheduled at this time.  Patient declines to use My Chart.  Thank you

## 2020-05-14 NOTE — Telephone Encounter (Signed)
Pt was instructed to follow up on or around 04/05/20. Contacted pt and she stated that she informed K.Danford,FNP that she may take a break from the program and she was reportedly told that was fine, as long as she does not take a 6 month long break from follow ups.  Would you like to refill this medication or have pt be seen in the office first?

## 2020-05-15 NOTE — Telephone Encounter (Signed)
Good Morning Christan, We discussed spacing our her appts- which is fine. She was to return 3 weeks after her last appt for labs and med refill- will need labs prior to refilling Metformin, Vit D, Crestor. Please have her schedule a fasting f/u then we can space out her appt's. Thanks! Orpha Bur

## 2020-05-15 NOTE — Telephone Encounter (Signed)
Contacted pt to inform her of the message below. Pt did not answer, and a detailed voicemail was left with NP message. Left office phone number and requested a call back to scheduled f/u visit.

## 2020-05-15 NOTE — Telephone Encounter (Signed)
Good Morning Ariel Carpenter, We discussed spacing our her appts- which is fine. She was to return 3 weeks after her last appt for labs and med refill- will need labs prior to refilling Metformin, Vit D, Crestor. Please have her schedule a fasting f/u then we can space out her appt's. Thanks! Orpha Bur

## 2021-01-14 ENCOUNTER — Other Ambulatory Visit: Payer: Self-pay | Admitting: Family Medicine

## 2021-01-14 DIAGNOSIS — Z1231 Encounter for screening mammogram for malignant neoplasm of breast: Secondary | ICD-10-CM

## 2021-03-01 ENCOUNTER — Other Ambulatory Visit: Payer: Self-pay

## 2021-03-01 ENCOUNTER — Ambulatory Visit
Admission: RE | Admit: 2021-03-01 | Discharge: 2021-03-01 | Disposition: A | Discharge status: Home / Self Care | Payer: BC Managed Care – PPO | Source: Ambulatory Visit | Attending: Family Medicine | Admitting: Family Medicine

## 2021-03-01 DIAGNOSIS — Z1231 Encounter for screening mammogram for malignant neoplasm of breast: Secondary | ICD-10-CM

## 2021-11-27 ENCOUNTER — Encounter (INDEPENDENT_AMBULATORY_CARE_PROVIDER_SITE_OTHER): Payer: Self-pay

## 2022-03-03 ENCOUNTER — Other Ambulatory Visit: Payer: Self-pay | Admitting: Family Medicine

## 2022-03-03 DIAGNOSIS — Z1231 Encounter for screening mammogram for malignant neoplasm of breast: Secondary | ICD-10-CM

## 2022-03-12 ENCOUNTER — Ambulatory Visit: Payer: BC Managed Care – PPO

## 2023-03-16 ENCOUNTER — Other Ambulatory Visit: Payer: Self-pay | Admitting: Family Medicine

## 2023-03-16 ENCOUNTER — Ambulatory Visit
Admission: RE | Admit: 2023-03-16 | Discharge: 2023-03-16 | Disposition: A | Discharge status: Home / Self Care | Payer: Medicare Other | Source: Ambulatory Visit | Attending: Family Medicine | Admitting: Family Medicine

## 2023-03-16 DIAGNOSIS — Z1231 Encounter for screening mammogram for malignant neoplasm of breast: Secondary | ICD-10-CM

## 2023-05-26 ENCOUNTER — Other Ambulatory Visit: Payer: Self-pay | Admitting: Nurse Practitioner

## 2023-05-26 DIAGNOSIS — N760 Acute vaginitis: Secondary | ICD-10-CM

## 2023-05-26 MED ORDER — METRONIDAZOLE 500 MG PO TABS: 500.0000 mg | ORAL_TABLET | Freq: Two times a day (BID) | ORAL | 0 refills | Status: AC

## 2023-07-28 IMAGING — MG MM DIGITAL SCREENING BILAT W/ TOMO AND CAD
8 series · 8 of 24 positions shown · non-contrast
Comparison: Previous exam(s).

CLINICAL DATA: Screening.

EXAM:
DIGITAL SCREENING BILATERAL MAMMOGRAM WITH TOMOSYNTHESIS AND CAD
TECHNIQUE: Bilateral screening digital craniocaudal and mediolateral oblique
mammograms were obtained. Bilateral screening digital breast
tomosynthesis was performed. The images were evaluated with
computer-aided detection.

[L MLO synth-2D]
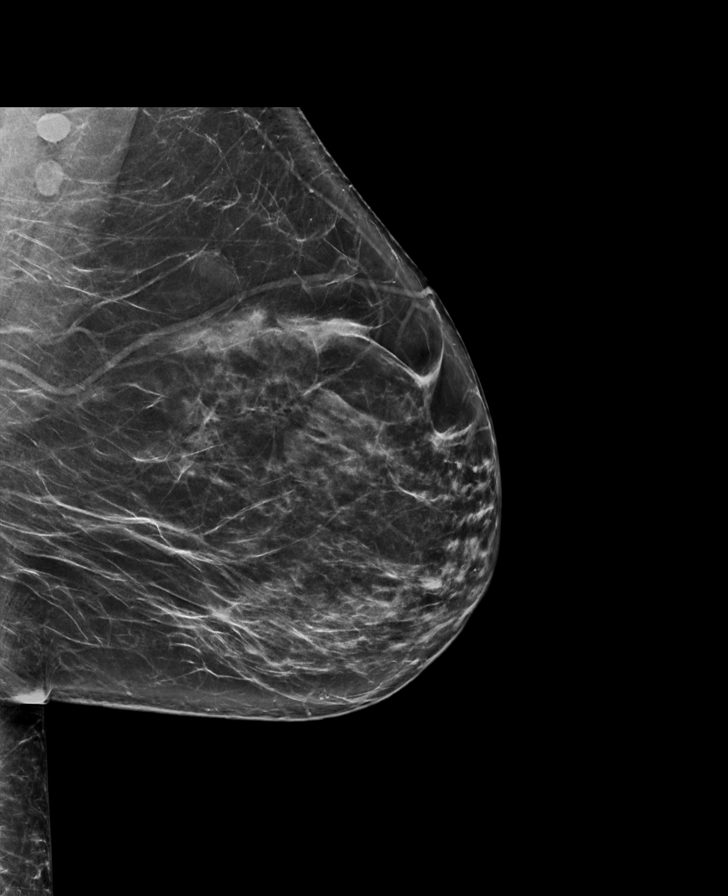

[R CC synth-2D]
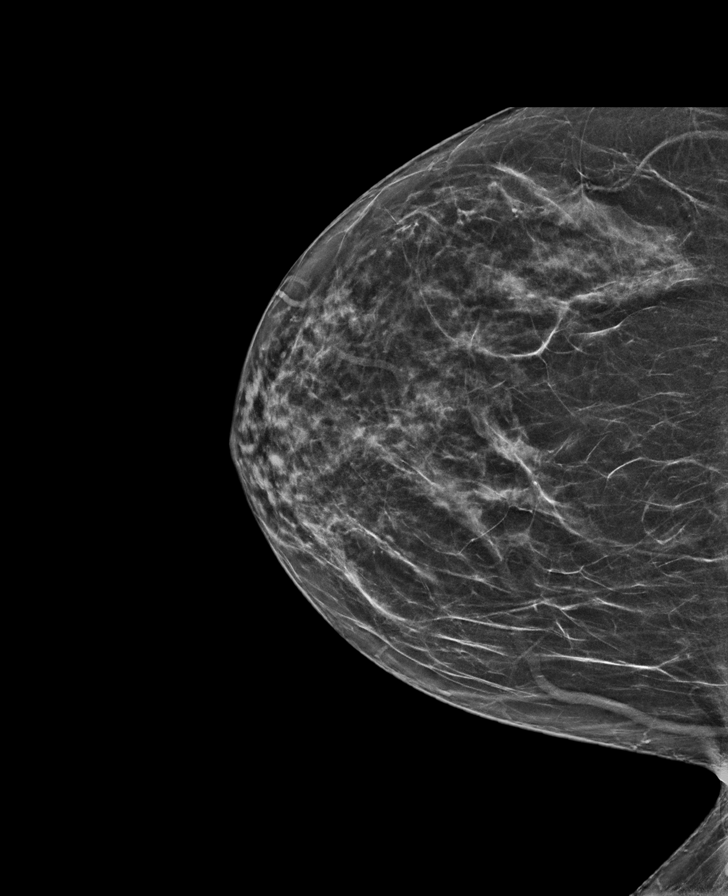

[L CC synth-2D]
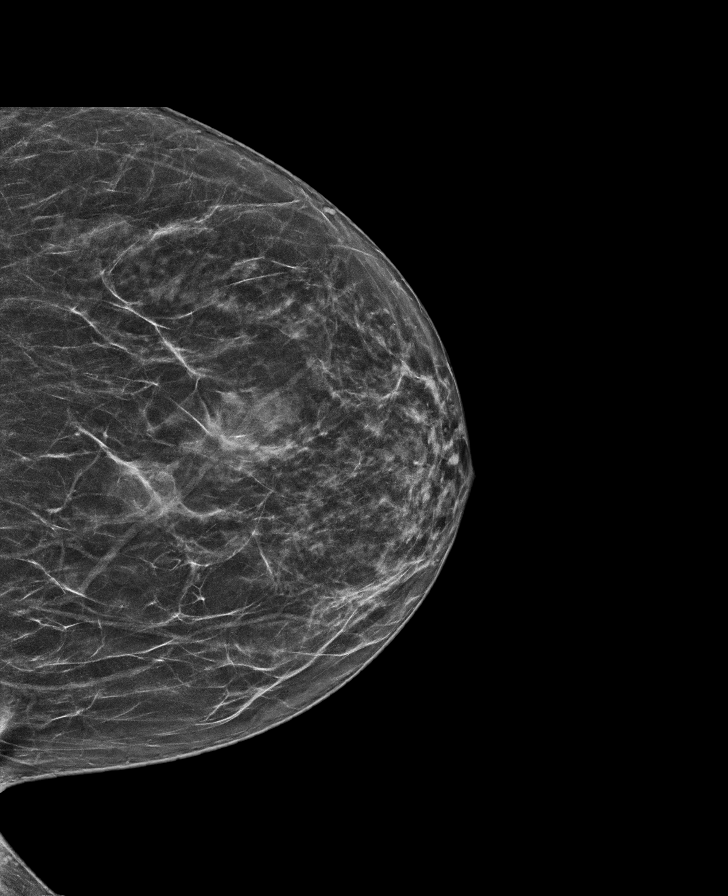

[R MLO synth-2D]
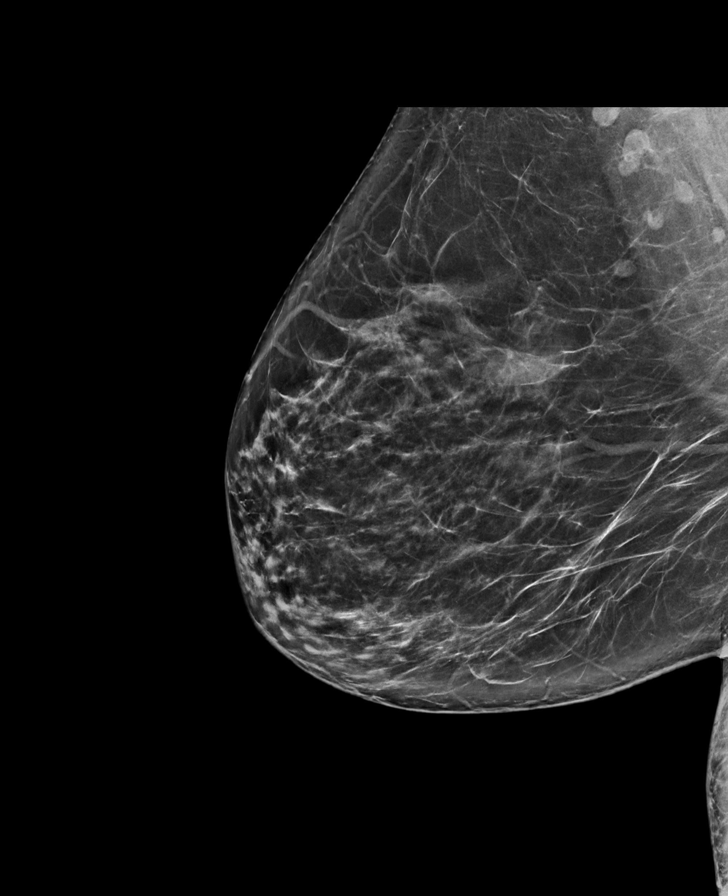

[L MLO tomo · tomo slice 43/84.0]
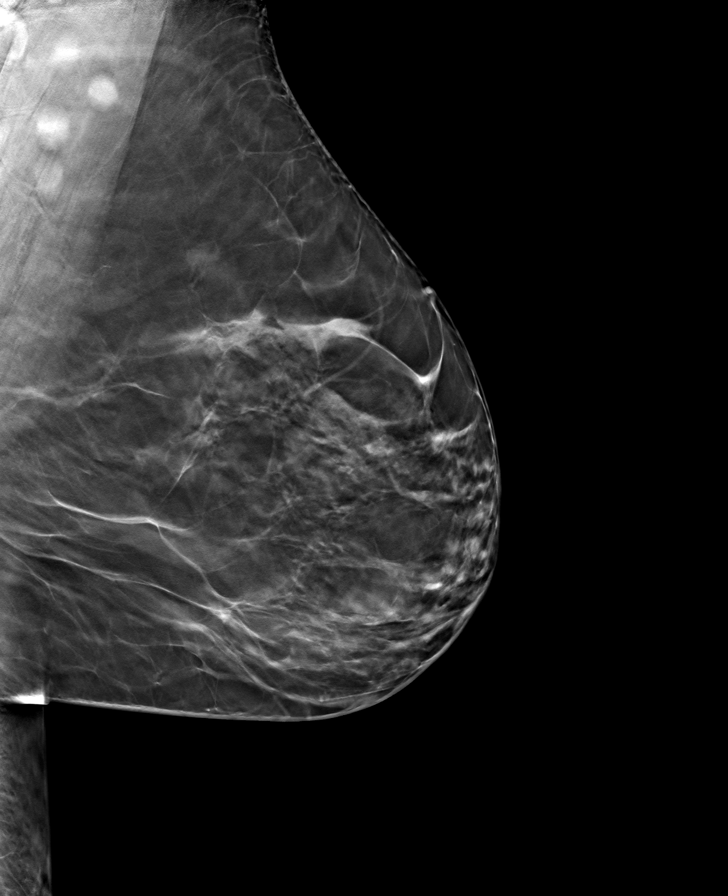

[R CC tomo · tomo slice 32/63.0]
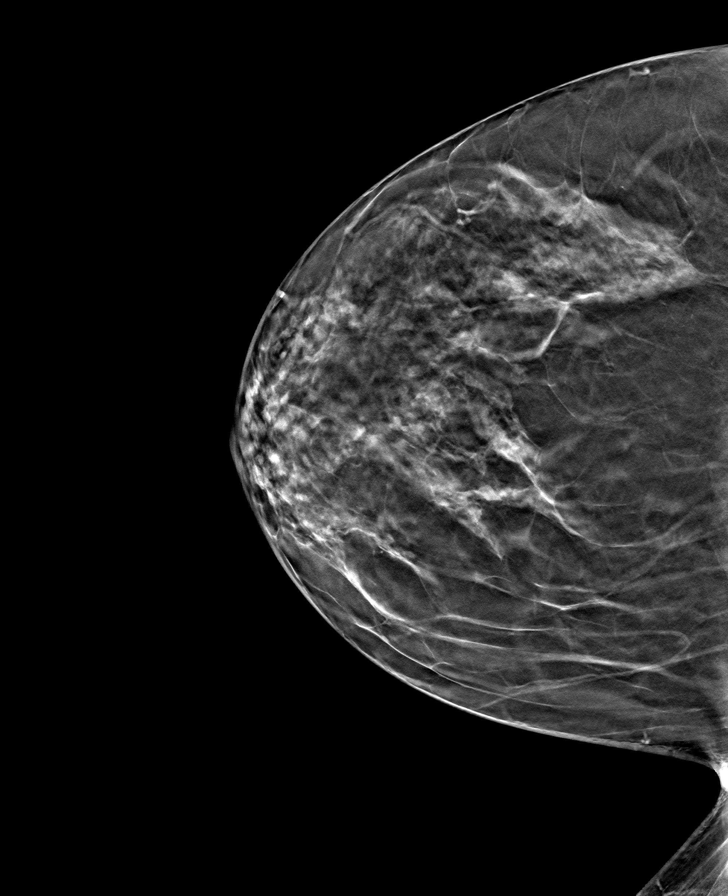

[R MLO tomo · tomo slice 42/83.0]
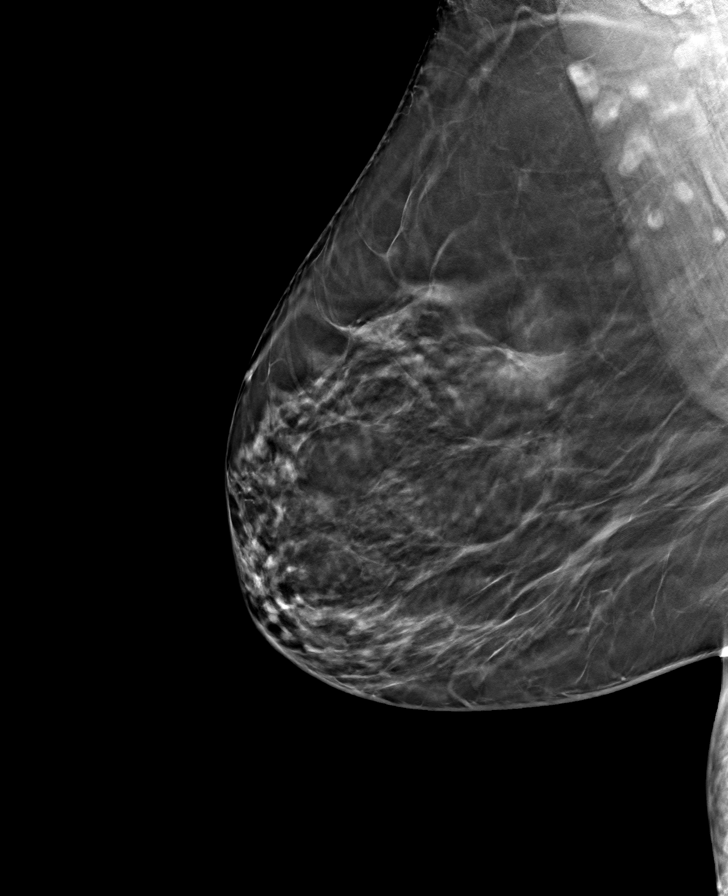

[L CC tomo · tomo slice 33/64.0]
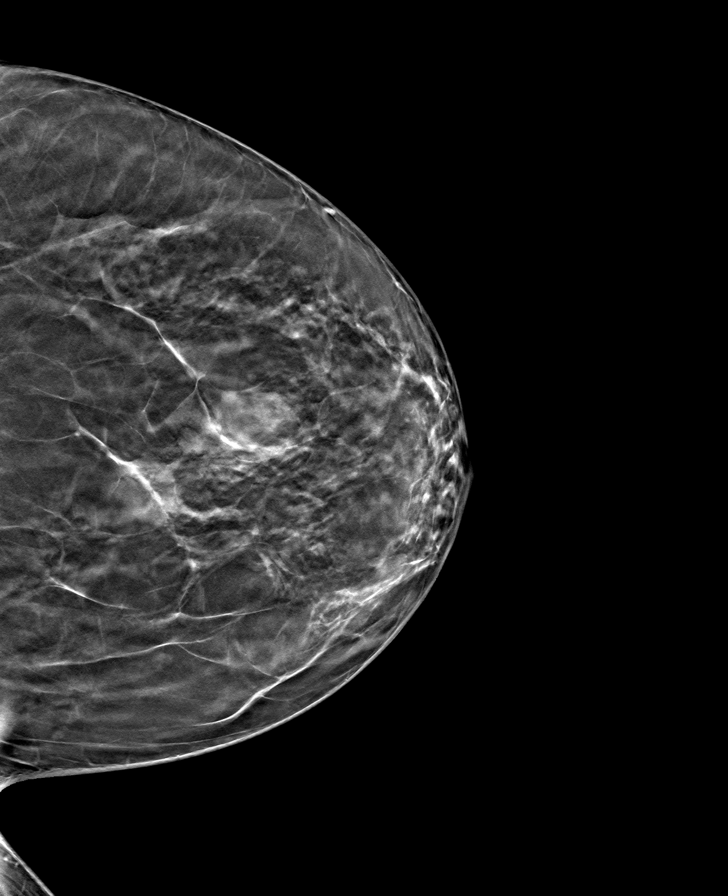

[8 of 24 positions shown; findings below may reference images not displayed]

ACR Breast Density Category c: The breast tissue is heterogeneously
dense, which may obscure small masses.
FINDINGS: There are no findings suspicious for malignancy.
IMPRESSION: No mammographic evidence of malignancy. A result letter of this
screening mammogram will be mailed directly to the patient.

RECOMMENDATION:
Screening mammogram in one year. (Code:[id number]-W-BC3)

BI-RADS CATEGORY  1: Negative.

## 2024-02-04 ENCOUNTER — Other Ambulatory Visit: Payer: Self-pay | Admitting: Family Medicine

## 2024-02-04 DIAGNOSIS — Z1231 Encounter for screening mammogram for malignant neoplasm of breast: Secondary | ICD-10-CM

## 2024-03-16 ENCOUNTER — Ambulatory Visit
Admission: RE | Admit: 2024-03-16 | Discharge: 2024-03-16 | Disposition: A | Source: Ambulatory Visit | Attending: Family Medicine | Admitting: Family Medicine

## 2024-03-16 DIAGNOSIS — Z1231 Encounter for screening mammogram for malignant neoplasm of breast: Secondary | ICD-10-CM
